# Patient Record
Sex: Female | Born: 1950 | Race: White | Hispanic: No | Marital: Married | Smoking: Current some day smoker
Health system: Southern US, Community
[De-identification: ages and names within clinical notes are randomized; demographics above are authoritative.]

## PROBLEM LIST (undated history)

## (undated) DIAGNOSIS — R42 Dizziness and giddiness: Secondary | ICD-10-CM

## (undated) DIAGNOSIS — M5430 Sciatica, unspecified side: Secondary | ICD-10-CM

## (undated) DIAGNOSIS — L4050 Arthropathic psoriasis, unspecified: Secondary | ICD-10-CM

## (undated) DIAGNOSIS — M4800 Spinal stenosis, site unspecified: Secondary | ICD-10-CM

## (undated) DIAGNOSIS — B029 Zoster without complications: Secondary | ICD-10-CM

## (undated) DIAGNOSIS — I10 Essential (primary) hypertension: Secondary | ICD-10-CM

## (undated) DIAGNOSIS — G4730 Sleep apnea, unspecified: Secondary | ICD-10-CM

## (undated) HISTORY — PX: HAND SURGERY: SHX662

## (undated) HISTORY — PX: NASAL SINUS SURGERY: SHX719

## (undated) HISTORY — PX: ABDOMINAL HYSTERECTOMY: SHX81

---

## 2012-12-03 DIAGNOSIS — M216X9 Other acquired deformities of unspecified foot: Secondary | ICD-10-CM | POA: Insufficient documentation

## 2012-12-03 DIAGNOSIS — M7750 Other enthesopathy of unspecified foot: Secondary | ICD-10-CM | POA: Insufficient documentation

## 2013-09-07 DIAGNOSIS — L4050 Arthropathic psoriasis, unspecified: Secondary | ICD-10-CM | POA: Insufficient documentation

## 2013-09-07 DIAGNOSIS — M1990 Unspecified osteoarthritis, unspecified site: Secondary | ICD-10-CM | POA: Insufficient documentation

## 2014-04-08 ENCOUNTER — Ambulatory Visit (INDEPENDENT_AMBULATORY_CARE_PROVIDER_SITE_OTHER): Payer: BLUE CROSS/BLUE SHIELD

## 2014-04-08 ENCOUNTER — Encounter: Payer: Self-pay | Admitting: Podiatry

## 2014-04-08 ENCOUNTER — Ambulatory Visit (INDEPENDENT_AMBULATORY_CARE_PROVIDER_SITE_OTHER): Payer: BLUE CROSS/BLUE SHIELD | Admitting: Podiatry

## 2014-04-08 VITALS — BP 113/76 | HR 77 | Resp 16 | Ht 63.0 in | Wt 182.0 lb

## 2014-04-08 DIAGNOSIS — M779 Enthesopathy, unspecified: Secondary | ICD-10-CM

## 2014-04-08 DIAGNOSIS — M21611 Bunion of right foot: Secondary | ICD-10-CM

## 2014-04-08 DIAGNOSIS — M2011 Hallux valgus (acquired), right foot: Secondary | ICD-10-CM

## 2014-04-08 NOTE — Progress Notes (Signed)
   Subjective:    Patient ID: Vanessa Clark, female    DOB: July 17, 1950, 64 y.o.   MRN: [id number]  HPI Comments: 64 year old female presents the office today with complaints of right foot pain. She states that she feels as if she is walking on a rock underneath her toes. 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 She states that she has difficulty walking on hardwood floors. She has so states that she has tried various shoe gear changes, custom orthotics as well as over-the-counter orthotics. She states that OTC orthotics helped more than the custom previously. She denies any recent injury or trauma to bilateral lower extremities. No other complaints at this time.  Foot Pain Associated symptoms include joint swelling.      Review of Systems  Constitutional:       Sweating   HENT: Positive for sinus pressure.   Musculoskeletal: Positive for joint swelling.  Allergic/Immunologic: Positive for environmental allergies.  All other systems reviewed and are negative.      Objective:   Physical Exam AAO 3, NAD DP/PT pulses palpable, CRT less than 3 seconds Protective sensation appears to be intact with Derrel Nip monofilament, vibratory sensation intact, Achilles tendon reflex intact. 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 On the right foot there is mild discomfort on the second MTPJ, mostly plantarly. There is no discomfort with MTPJ range of motion. There is no areas of pinpoint bony tenderness or pain with vibratory sensation to metatarsals or the digits bilaterally. There is trace edema overlying the second MTPJ without any overlying erythema or increase in warmth to the right. There is no overlying edema, erythema, increase in warmth to the left lower extremity. MMT 5/5, ROM WNL No open lesions or pre-ulcerative lesions. No pain with calf compression, swelling, warmth, erythema.    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 -Treatment options were discussed with the patient including alternatives, risks, complications. -I discussed the patient various shoe gear changes and orthotics to help offload the symptomatic area. She states that she has orthotics at home. I offered a steroid injection into the area however the patient declined. She is already taking meloxicam, which she states helps some. Discussed a graphite insert, however patient declined.  -Dispensed metatarsal pads to help offload the symptomatic area. -Follow-up as needed. In the meantime occurs call the office with any questions, concerns, changes symptoms. Discussed with her that if the symptoms have not resolved the next 2-4 weeks to call the office for follow-up appointment.

## 2014-04-12 ENCOUNTER — Telehealth: Payer: Self-pay | Admitting: *Deleted

## 2014-04-12 NOTE — Telephone Encounter (Signed)
It was the compound cream. I thought I had filled it out. If not, I will take care of it tomorrow. Thanks.

## 2014-04-12 NOTE — Telephone Encounter (Signed)
Pt called and stated you were going to give her a steroid cream. States she did not receive it.

## 2014-04-19 ENCOUNTER — Telehealth: Payer: Self-pay | Admitting: *Deleted

## 2014-04-19 ENCOUNTER — Telehealth: Payer: Self-pay

## 2014-04-19 NOTE — Telephone Encounter (Signed)
Pt called wanting to know if she can purchase some pads that Dr Jacqualyn Posey gave her last week, or where to purchase them from

## 2014-04-19 NOTE — Telephone Encounter (Signed)
Returned pts call. Asked pt to call back. (wants to purchase pads dr Jacqualyn Posey gave her)

## 2014-04-19 NOTE — Telephone Encounter (Signed)
Pt called wanting to know if she can purchase pads dr Jacqualyn Posey gave her. Told pt she can purchase pads here. Told pt cost is $3.00 a piece per cindy. Pt stated she will come by office to purchase some.

## 2014-04-22 DIAGNOSIS — M2011 Hallux valgus (acquired), right foot: Secondary | ICD-10-CM

## 2016-08-08 DIAGNOSIS — G4733 Obstructive sleep apnea (adult) (pediatric): Secondary | ICD-10-CM | POA: Diagnosis not present

## 2016-09-08 DIAGNOSIS — G4733 Obstructive sleep apnea (adult) (pediatric): Secondary | ICD-10-CM | POA: Diagnosis not present

## 2016-10-08 DIAGNOSIS — G4733 Obstructive sleep apnea (adult) (pediatric): Secondary | ICD-10-CM | POA: Diagnosis not present

## 2016-11-05 DIAGNOSIS — G4733 Obstructive sleep apnea (adult) (pediatric): Secondary | ICD-10-CM | POA: Diagnosis not present

## 2016-11-08 DIAGNOSIS — G4733 Obstructive sleep apnea (adult) (pediatric): Secondary | ICD-10-CM | POA: Diagnosis not present

## 2016-11-15 DIAGNOSIS — G4733 Obstructive sleep apnea (adult) (pediatric): Secondary | ICD-10-CM | POA: Diagnosis not present

## 2016-11-20 DIAGNOSIS — G4733 Obstructive sleep apnea (adult) (pediatric): Secondary | ICD-10-CM | POA: Diagnosis not present

## 2016-12-09 DIAGNOSIS — G4733 Obstructive sleep apnea (adult) (pediatric): Secondary | ICD-10-CM | POA: Diagnosis not present

## 2017-01-07 DIAGNOSIS — G4733 Obstructive sleep apnea (adult) (pediatric): Secondary | ICD-10-CM | POA: Diagnosis not present

## 2017-01-08 DIAGNOSIS — G4733 Obstructive sleep apnea (adult) (pediatric): Secondary | ICD-10-CM | POA: Diagnosis not present

## 2017-01-09 DIAGNOSIS — L821 Other seborrheic keratosis: Secondary | ICD-10-CM | POA: Diagnosis not present

## 2017-01-09 DIAGNOSIS — L708 Other acne: Secondary | ICD-10-CM | POA: Diagnosis not present

## 2017-01-09 DIAGNOSIS — L739 Follicular disorder, unspecified: Secondary | ICD-10-CM | POA: Diagnosis not present

## 2017-01-09 DIAGNOSIS — L820 Inflamed seborrheic keratosis: Secondary | ICD-10-CM | POA: Diagnosis not present

## 2017-01-09 DIAGNOSIS — L720 Epidermal cyst: Secondary | ICD-10-CM | POA: Diagnosis not present

## 2017-01-21 DIAGNOSIS — F900 Attention-deficit hyperactivity disorder, predominantly inattentive type: Secondary | ICD-10-CM | POA: Diagnosis not present

## 2017-01-21 DIAGNOSIS — Z6833 Body mass index (BMI) 33.0-33.9, adult: Secondary | ICD-10-CM | POA: Diagnosis not present

## 2017-01-21 DIAGNOSIS — F411 Generalized anxiety disorder: Secondary | ICD-10-CM | POA: Diagnosis not present

## 2017-02-18 DIAGNOSIS — G4733 Obstructive sleep apnea (adult) (pediatric): Secondary | ICD-10-CM | POA: Diagnosis not present

## 2017-03-05 DIAGNOSIS — E669 Obesity, unspecified: Secondary | ICD-10-CM | POA: Diagnosis not present

## 2017-03-05 DIAGNOSIS — L4050 Arthropathic psoriasis, unspecified: Secondary | ICD-10-CM | POA: Diagnosis not present

## 2017-03-05 DIAGNOSIS — Z79899 Other long term (current) drug therapy: Secondary | ICD-10-CM | POA: Diagnosis not present

## 2017-03-05 DIAGNOSIS — M19041 Primary osteoarthritis, right hand: Secondary | ICD-10-CM | POA: Diagnosis not present

## 2017-03-05 DIAGNOSIS — G4733 Obstructive sleep apnea (adult) (pediatric): Secondary | ICD-10-CM | POA: Insufficient documentation

## 2017-03-05 DIAGNOSIS — Z90710 Acquired absence of both cervix and uterus: Secondary | ICD-10-CM | POA: Diagnosis not present

## 2017-03-05 DIAGNOSIS — M19042 Primary osteoarthritis, left hand: Secondary | ICD-10-CM | POA: Diagnosis not present

## 2017-03-05 DIAGNOSIS — Z23 Encounter for immunization: Secondary | ICD-10-CM | POA: Diagnosis not present

## 2017-03-05 DIAGNOSIS — M8589 Other specified disorders of bone density and structure, multiple sites: Secondary | ICD-10-CM | POA: Diagnosis not present

## 2017-03-05 DIAGNOSIS — Z6831 Body mass index (BMI) 31.0-31.9, adult: Secondary | ICD-10-CM | POA: Diagnosis not present

## 2017-03-05 DIAGNOSIS — M170 Bilateral primary osteoarthritis of knee: Secondary | ICD-10-CM | POA: Diagnosis not present

## 2017-03-10 DIAGNOSIS — G4733 Obstructive sleep apnea (adult) (pediatric): Secondary | ICD-10-CM | POA: Diagnosis not present

## 2017-03-15 DIAGNOSIS — M8589 Other specified disorders of bone density and structure, multiple sites: Secondary | ICD-10-CM | POA: Diagnosis not present

## 2017-03-15 DIAGNOSIS — Z780 Asymptomatic menopausal state: Secondary | ICD-10-CM | POA: Diagnosis not present

## 2017-04-04 DIAGNOSIS — H04563 Stenosis of bilateral lacrimal punctum: Secondary | ICD-10-CM | POA: Diagnosis not present

## 2017-04-10 DIAGNOSIS — H2513 Age-related nuclear cataract, bilateral: Secondary | ICD-10-CM | POA: Diagnosis not present

## 2017-05-11 DIAGNOSIS — G4733 Obstructive sleep apnea (adult) (pediatric): Secondary | ICD-10-CM | POA: Diagnosis not present

## 2017-05-15 DIAGNOSIS — Z1389 Encounter for screening for other disorder: Secondary | ICD-10-CM | POA: Diagnosis not present

## 2017-05-15 DIAGNOSIS — Z79899 Other long term (current) drug therapy: Secondary | ICD-10-CM | POA: Diagnosis not present

## 2017-05-15 DIAGNOSIS — K219 Gastro-esophageal reflux disease without esophagitis: Secondary | ICD-10-CM | POA: Diagnosis not present

## 2017-05-15 DIAGNOSIS — R635 Abnormal weight gain: Secondary | ICD-10-CM | POA: Diagnosis not present

## 2017-05-15 DIAGNOSIS — F321 Major depressive disorder, single episode, moderate: Secondary | ICD-10-CM | POA: Diagnosis not present

## 2017-05-15 DIAGNOSIS — Z6834 Body mass index (BMI) 34.0-34.9, adult: Secondary | ICD-10-CM | POA: Diagnosis not present

## 2017-05-15 DIAGNOSIS — L821 Other seborrheic keratosis: Secondary | ICD-10-CM | POA: Diagnosis not present

## 2017-05-15 DIAGNOSIS — D518 Other vitamin B12 deficiency anemias: Secondary | ICD-10-CM | POA: Diagnosis not present

## 2017-05-15 DIAGNOSIS — Z0000 Encounter for general adult medical examination without abnormal findings: Secondary | ICD-10-CM | POA: Diagnosis not present

## 2017-06-08 DIAGNOSIS — G4733 Obstructive sleep apnea (adult) (pediatric): Secondary | ICD-10-CM | POA: Diagnosis not present

## 2017-07-04 DIAGNOSIS — H04569 Stenosis of unspecified lacrimal punctum: Secondary | ICD-10-CM | POA: Diagnosis not present

## 2017-07-09 DIAGNOSIS — G4733 Obstructive sleep apnea (adult) (pediatric): Secondary | ICD-10-CM | POA: Diagnosis not present

## 2017-08-08 DIAGNOSIS — L820 Inflamed seborrheic keratosis: Secondary | ICD-10-CM | POA: Diagnosis not present

## 2017-08-08 DIAGNOSIS — G4733 Obstructive sleep apnea (adult) (pediatric): Secondary | ICD-10-CM | POA: Diagnosis not present

## 2017-08-08 DIAGNOSIS — L821 Other seborrheic keratosis: Secondary | ICD-10-CM | POA: Diagnosis not present

## 2017-08-15 ENCOUNTER — Encounter: Payer: Self-pay | Admitting: *Deleted

## 2017-08-15 ENCOUNTER — Other Ambulatory Visit: Payer: Self-pay | Admitting: *Deleted

## 2017-08-15 NOTE — Patient Outreach (Signed)
[facility]) Care Management  08/15/2017  Vanessa Clark December 25, 1950 [id number]  Referral from [place]. Reason: Member request for help & guidance with medical diagnosis-Vertigo.  Telephone call to patient who was advised of reason for call. HIPPA verification received.  Patient verified that she currently has [employer] insurance.   Patient voices that she has been to her MD office & has received information on vertigo. States she needs no further information and does not currently need [facility] services.   States she already has contact information and does not need educational information regarding [facility] services.   Patient has declined services and has no healthcare concerns.  Plan: Case closure.   Sherrin Daisy, RN BSN [place] Management Coordinator [facility] Care Management  [phone]

## 2017-09-30 DIAGNOSIS — G4733 Obstructive sleep apnea (adult) (pediatric): Secondary | ICD-10-CM | POA: Diagnosis not present

## 2017-11-05 DIAGNOSIS — Z1231 Encounter for screening mammogram for malignant neoplasm of breast: Secondary | ICD-10-CM | POA: Diagnosis not present

## 2017-12-18 ENCOUNTER — Encounter: Payer: Self-pay | Admitting: Podiatry

## 2017-12-18 ENCOUNTER — Ambulatory Visit: Payer: PPO | Admitting: Podiatry

## 2017-12-18 DIAGNOSIS — L600 Ingrowing nail: Secondary | ICD-10-CM

## 2017-12-18 MED ORDER — NEOMYCIN-POLYMYXIN-HC 1 % OT SOLN: OTIC | 1 refills | Status: AC

## 2017-12-18 NOTE — Progress Notes (Signed)
  Subjective:  Patient ID: Vanessa Clark, female    DOB: Jul 13, 1950,  MRN: [id number] HPI Chief Complaint  Patient presents with  . Nail Problem    Toenails bilateral - thick and discolored nails x years, thinks it could be from her psoriatic arthritis, right hallux is tender though  . New Patient (Initial Visit)    Est pt 04/2014    67 y.o. female presents with the above complaint.   ROS: Denies fever chills nausea vomiting muscle aches pains calf pain back pain chest pain shortness of breath.  No past medical history on file.   Current Outpatient Medications:  .  Adalimumab (HUMIRA [place]), Inject into the skin., Disp: , Rfl:  .  ALPRAZolam (XANAX) 0.5 MG tablet, Take 0.5 mg by mouth., Disp: , Rfl:  .  betamethasone dipropionate (DIPROLENE) 0.05 % cream, APPLY TO AFFECTED AREA TWICE A DAY, Disp: , Rfl: 2 .  loratadine (CLARITIN) 10 MG tablet, Frequency:PRN   Dosage:0.0     Instructions:  Note:Dose: UNKNOWN, Disp: , Rfl:  .  meloxicam (MOBIC) 15 MG tablet, Take 15 mg by mouth., Disp: , Rfl:  .  methylphenidate 10 MG ER tablet, , Disp: , Rfl:  .  NEOMYCIN-POLYMYXIN-HYDROCORTISONE (CORTISPORIN) 1 % SOLN OTIC solution, Apply 1-2 drops to toe BID after soaking, Disp: 10 mL, Rfl: 1 .  omeprazole (PRILOSEC) 20 MG capsule, Take 20 mg by mouth., Disp: , Rfl:  .  Probiotic Product (PROBIOTIC DAILY PO), Take by mouth daily., Disp: , Rfl:  .  venlafaxine XR (EFFEXOR-XR) 75 MG 24 hr capsule, Take 75 mg by mouth., Disp: , Rfl:   Allergies  Allergen Reactions  . Sulfa Antibiotics Other (See Comments)    Not effective   Review of Systems Objective:  There were no vitals filed for this visit.  General: Well developed, nourished, in no acute distress, alert and oriented x3   Dermatological: Skin is warm, dry and supple bilateral. Nails x 10 are well maintained; remaining integument appears unremarkable at this time. There are no open sores, no preulcerative lesions, no rash or signs of  infection present.  Sharp incurvated nail margin along the tibial border of the hallux right.  No purulence no malodor.  Vascular: Dorsalis Pedis artery and Posterior Tibial artery pedal pulses are 2/4 bilateral with immedate capillary fill time. Pedal hair growth present. No varicosities and no lower extremity edema present bilateral.   Neruologic: Grossly intact via light touch bilateral. Vibratory intact via tuning fork bilateral. Protective threshold with Semmes Wienstein monofilament intact to all pedal sites bilateral. Patellar and Achilles deep tendon reflexes 2+ bilateral. No Babinski or clonus noted bilateral.   Musculoskeletal: No gross boney pedal deformities bilateral. No pain, crepitus, or limitation noted with foot and ankle range of motion bilateral. Muscular strength 5/5 in all groups tested bilateral.  Gait: Unassisted, Nonantalgic.    Radiographs:  None taken  Assessment & Plan:   Assessment: Ingrown toenail tibial border hallux right.  Plan: Discussed etiology pathology conservative versus surgical therapies.  At this point a chemical matrixectomy was performed along the tibial border.  Tolerated procedure well after local anesthesia was administered without complications.  She was given both oral and written home-going instruction for care and soaking of the toe as well as prescription for Corticosporin otic to be applied twice daily after soaking.  I will follow-up with her in 2 weeks.     Max T. [place], [place]

## 2017-12-18 NOTE — Patient Instructions (Signed)

## 2017-12-19 DIAGNOSIS — F411 Generalized anxiety disorder: Secondary | ICD-10-CM | POA: Diagnosis not present

## 2017-12-19 DIAGNOSIS — F321 Major depressive disorder, single episode, moderate: Secondary | ICD-10-CM | POA: Diagnosis not present

## 2017-12-19 DIAGNOSIS — Z6834 Body mass index (BMI) 34.0-34.9, adult: Secondary | ICD-10-CM | POA: Diagnosis not present

## 2018-01-01 ENCOUNTER — Ambulatory Visit: Payer: PPO | Admitting: Podiatry

## 2018-01-06 DIAGNOSIS — G4733 Obstructive sleep apnea (adult) (pediatric): Secondary | ICD-10-CM | POA: Diagnosis not present

## 2018-01-08 DIAGNOSIS — L820 Inflamed seborrheic keratosis: Secondary | ICD-10-CM | POA: Diagnosis not present

## 2018-01-08 DIAGNOSIS — L814 Other melanin hyperpigmentation: Secondary | ICD-10-CM | POA: Diagnosis not present

## 2018-01-08 DIAGNOSIS — L821 Other seborrheic keratosis: Secondary | ICD-10-CM | POA: Diagnosis not present

## 2018-01-08 DIAGNOSIS — D229 Melanocytic nevi, unspecified: Secondary | ICD-10-CM | POA: Diagnosis not present

## 2018-01-08 DIAGNOSIS — L738 Other specified follicular disorders: Secondary | ICD-10-CM | POA: Diagnosis not present

## 2018-03-04 DIAGNOSIS — G4733 Obstructive sleep apnea (adult) (pediatric): Secondary | ICD-10-CM | POA: Diagnosis not present

## 2018-03-04 DIAGNOSIS — Z9989 Dependence on other enabling machines and devices: Secondary | ICD-10-CM | POA: Diagnosis not present

## 2018-03-04 DIAGNOSIS — I4510 Unspecified right bundle-branch block: Secondary | ICD-10-CM | POA: Diagnosis not present

## 2018-03-04 DIAGNOSIS — M159 Polyosteoarthritis, unspecified: Secondary | ICD-10-CM | POA: Diagnosis not present

## 2018-03-04 DIAGNOSIS — L821 Other seborrheic keratosis: Secondary | ICD-10-CM | POA: Diagnosis not present

## 2018-03-04 DIAGNOSIS — M25561 Pain in right knee: Secondary | ICD-10-CM | POA: Diagnosis not present

## 2018-03-04 DIAGNOSIS — L918 Other hypertrophic disorders of the skin: Secondary | ICD-10-CM | POA: Diagnosis not present

## 2018-03-04 DIAGNOSIS — M7918 Myalgia, other site: Secondary | ICD-10-CM | POA: Diagnosis not present

## 2018-03-04 DIAGNOSIS — L4050 Arthropathic psoriasis, unspecified: Secondary | ICD-10-CM | POA: Diagnosis not present

## 2018-03-04 DIAGNOSIS — Z6836 Body mass index (BMI) 36.0-36.9, adult: Secondary | ICD-10-CM | POA: Diagnosis not present

## 2018-03-04 DIAGNOSIS — M5136 Other intervertebral disc degeneration, lumbar region: Secondary | ICD-10-CM | POA: Diagnosis not present

## 2018-03-04 DIAGNOSIS — I4891 Unspecified atrial fibrillation: Secondary | ICD-10-CM | POA: Diagnosis not present

## 2018-03-19 DIAGNOSIS — F321 Major depressive disorder, single episode, moderate: Secondary | ICD-10-CM | POA: Diagnosis not present

## 2018-03-19 DIAGNOSIS — L821 Other seborrheic keratosis: Secondary | ICD-10-CM | POA: Diagnosis not present

## 2018-03-19 DIAGNOSIS — R030 Elevated blood-pressure reading, without diagnosis of hypertension: Secondary | ICD-10-CM | POA: Diagnosis not present

## 2018-03-19 DIAGNOSIS — F411 Generalized anxiety disorder: Secondary | ICD-10-CM | POA: Diagnosis not present

## 2018-03-19 DIAGNOSIS — Z6833 Body mass index (BMI) 33.0-33.9, adult: Secondary | ICD-10-CM | POA: Diagnosis not present

## 2018-04-08 DIAGNOSIS — G4733 Obstructive sleep apnea (adult) (pediatric): Secondary | ICD-10-CM | POA: Diagnosis not present

## 2018-04-14 DIAGNOSIS — L820 Inflamed seborrheic keratosis: Secondary | ICD-10-CM | POA: Diagnosis not present

## 2018-04-14 DIAGNOSIS — L821 Other seborrheic keratosis: Secondary | ICD-10-CM | POA: Diagnosis not present

## 2018-05-06 DIAGNOSIS — Z0120 Encounter for dental examination and cleaning without abnormal findings: Secondary | ICD-10-CM | POA: Diagnosis not present

## 2018-05-08 DIAGNOSIS — R0789 Other chest pain: Secondary | ICD-10-CM | POA: Diagnosis not present

## 2018-05-08 DIAGNOSIS — Z79899 Other long term (current) drug therapy: Secondary | ICD-10-CM | POA: Diagnosis not present

## 2018-05-08 DIAGNOSIS — F411 Generalized anxiety disorder: Secondary | ICD-10-CM | POA: Diagnosis not present

## 2018-05-08 DIAGNOSIS — D519 Vitamin B12 deficiency anemia, unspecified: Secondary | ICD-10-CM | POA: Diagnosis not present

## 2018-05-08 DIAGNOSIS — I10 Essential (primary) hypertension: Secondary | ICD-10-CM | POA: Diagnosis not present

## 2018-05-08 DIAGNOSIS — Z6835 Body mass index (BMI) 35.0-35.9, adult: Secondary | ICD-10-CM | POA: Diagnosis not present

## 2018-05-15 DIAGNOSIS — I208 Other forms of angina pectoris: Secondary | ICD-10-CM | POA: Diagnosis not present

## 2018-05-15 DIAGNOSIS — R002 Palpitations: Secondary | ICD-10-CM | POA: Diagnosis not present

## 2018-05-15 DIAGNOSIS — R0602 Shortness of breath: Secondary | ICD-10-CM | POA: Insufficient documentation

## 2018-05-15 DIAGNOSIS — I10 Essential (primary) hypertension: Secondary | ICD-10-CM | POA: Insufficient documentation

## 2018-05-15 DIAGNOSIS — E782 Mixed hyperlipidemia: Secondary | ICD-10-CM | POA: Diagnosis not present

## 2018-06-03 DIAGNOSIS — I208 Other forms of angina pectoris: Secondary | ICD-10-CM | POA: Diagnosis not present

## 2018-06-04 DIAGNOSIS — R002 Palpitations: Secondary | ICD-10-CM | POA: Diagnosis not present

## 2018-06-13 DIAGNOSIS — R002 Palpitations: Secondary | ICD-10-CM | POA: Diagnosis not present

## 2018-06-13 DIAGNOSIS — R0602 Shortness of breath: Secondary | ICD-10-CM | POA: Diagnosis not present

## 2018-06-13 DIAGNOSIS — I10 Essential (primary) hypertension: Secondary | ICD-10-CM | POA: Diagnosis not present

## 2018-06-13 DIAGNOSIS — E782 Mixed hyperlipidemia: Secondary | ICD-10-CM | POA: Diagnosis not present

## 2018-06-18 DIAGNOSIS — I10 Essential (primary) hypertension: Secondary | ICD-10-CM | POA: Diagnosis not present

## 2018-06-18 DIAGNOSIS — Z6835 Body mass index (BMI) 35.0-35.9, adult: Secondary | ICD-10-CM | POA: Diagnosis not present

## 2018-06-18 DIAGNOSIS — F411 Generalized anxiety disorder: Secondary | ICD-10-CM | POA: Diagnosis not present

## 2018-07-08 DIAGNOSIS — G4733 Obstructive sleep apnea (adult) (pediatric): Secondary | ICD-10-CM | POA: Diagnosis not present

## 2018-10-06 DIAGNOSIS — G4733 Obstructive sleep apnea (adult) (pediatric): Secondary | ICD-10-CM | POA: Diagnosis not present

## 2018-10-13 DIAGNOSIS — L578 Other skin changes due to chronic exposure to nonionizing radiation: Secondary | ICD-10-CM | POA: Diagnosis not present

## 2018-10-13 DIAGNOSIS — L820 Inflamed seborrheic keratosis: Secondary | ICD-10-CM | POA: Diagnosis not present

## 2018-10-13 DIAGNOSIS — L821 Other seborrheic keratosis: Secondary | ICD-10-CM | POA: Diagnosis not present

## 2018-10-29 DIAGNOSIS — B078 Other viral warts: Secondary | ICD-10-CM | POA: Diagnosis not present

## 2018-10-29 DIAGNOSIS — L821 Other seborrheic keratosis: Secondary | ICD-10-CM | POA: Diagnosis not present

## 2018-10-29 DIAGNOSIS — L918 Other hypertrophic disorders of the skin: Secondary | ICD-10-CM | POA: Diagnosis not present

## 2018-10-29 DIAGNOSIS — L820 Inflamed seborrheic keratosis: Secondary | ICD-10-CM | POA: Diagnosis not present

## 2018-12-02 DIAGNOSIS — M160 Bilateral primary osteoarthritis of hip: Secondary | ICD-10-CM | POA: Diagnosis not present

## 2018-12-02 DIAGNOSIS — E669 Obesity, unspecified: Secondary | ICD-10-CM | POA: Diagnosis not present

## 2018-12-02 DIAGNOSIS — Z79899 Other long term (current) drug therapy: Secondary | ICD-10-CM | POA: Diagnosis not present

## 2018-12-02 DIAGNOSIS — M5136 Other intervertebral disc degeneration, lumbar region: Secondary | ICD-10-CM | POA: Diagnosis not present

## 2018-12-02 DIAGNOSIS — M1712 Unilateral primary osteoarthritis, left knee: Secondary | ICD-10-CM | POA: Diagnosis not present

## 2018-12-02 DIAGNOSIS — M170 Bilateral primary osteoarthritis of knee: Secondary | ICD-10-CM | POA: Diagnosis not present

## 2018-12-02 DIAGNOSIS — N200 Calculus of kidney: Secondary | ICD-10-CM | POA: Diagnosis not present

## 2018-12-02 DIAGNOSIS — L4050 Arthropathic psoriasis, unspecified: Secondary | ICD-10-CM | POA: Diagnosis not present

## 2018-12-02 DIAGNOSIS — M1711 Unilateral primary osteoarthritis, right knee: Secondary | ICD-10-CM | POA: Diagnosis not present

## 2018-12-02 DIAGNOSIS — Z6835 Body mass index (BMI) 35.0-35.9, adult: Secondary | ICD-10-CM | POA: Diagnosis not present

## 2018-12-22 DIAGNOSIS — M25561 Pain in right knee: Secondary | ICD-10-CM | POA: Diagnosis not present

## 2018-12-31 DIAGNOSIS — M25561 Pain in right knee: Secondary | ICD-10-CM | POA: Diagnosis not present

## 2019-01-05 DIAGNOSIS — G4733 Obstructive sleep apnea (adult) (pediatric): Secondary | ICD-10-CM | POA: Diagnosis not present

## 2019-01-06 DIAGNOSIS — G4733 Obstructive sleep apnea (adult) (pediatric): Secondary | ICD-10-CM | POA: Diagnosis not present

## 2019-01-08 DIAGNOSIS — M25561 Pain in right knee: Secondary | ICD-10-CM | POA: Diagnosis not present

## 2019-01-16 DIAGNOSIS — M25561 Pain in right knee: Secondary | ICD-10-CM | POA: Diagnosis not present

## 2019-01-23 DIAGNOSIS — M25561 Pain in right knee: Secondary | ICD-10-CM | POA: Diagnosis not present

## 2019-02-02 DIAGNOSIS — M25561 Pain in right knee: Secondary | ICD-10-CM | POA: Diagnosis not present

## 2019-02-06 DIAGNOSIS — L918 Other hypertrophic disorders of the skin: Secondary | ICD-10-CM | POA: Diagnosis not present

## 2019-02-06 DIAGNOSIS — L820 Inflamed seborrheic keratosis: Secondary | ICD-10-CM | POA: Diagnosis not present

## 2019-02-06 DIAGNOSIS — L821 Other seborrheic keratosis: Secondary | ICD-10-CM | POA: Diagnosis not present

## 2019-02-06 DIAGNOSIS — B079 Viral wart, unspecified: Secondary | ICD-10-CM | POA: Diagnosis not present

## 2019-03-02 DIAGNOSIS — M25561 Pain in right knee: Secondary | ICD-10-CM | POA: Diagnosis not present

## 2019-03-04 DIAGNOSIS — L821 Other seborrheic keratosis: Secondary | ICD-10-CM | POA: Diagnosis not present

## 2019-03-04 DIAGNOSIS — I10 Essential (primary) hypertension: Secondary | ICD-10-CM | POA: Diagnosis not present

## 2019-03-04 DIAGNOSIS — F411 Generalized anxiety disorder: Secondary | ICD-10-CM | POA: Diagnosis not present

## 2019-03-04 DIAGNOSIS — E669 Obesity, unspecified: Secondary | ICD-10-CM | POA: Diagnosis not present

## 2019-03-05 DIAGNOSIS — L821 Other seborrheic keratosis: Secondary | ICD-10-CM | POA: Diagnosis not present

## 2019-03-05 DIAGNOSIS — L820 Inflamed seborrheic keratosis: Secondary | ICD-10-CM | POA: Diagnosis not present

## 2019-03-05 DIAGNOSIS — B078 Other viral warts: Secondary | ICD-10-CM | POA: Diagnosis not present

## 2019-03-10 DIAGNOSIS — H2513 Age-related nuclear cataract, bilateral: Secondary | ICD-10-CM | POA: Diagnosis not present

## 2019-03-18 DIAGNOSIS — Z1211 Encounter for screening for malignant neoplasm of colon: Secondary | ICD-10-CM | POA: Diagnosis not present

## 2019-03-23 DIAGNOSIS — Z6838 Body mass index (BMI) 38.0-38.9, adult: Secondary | ICD-10-CM | POA: Diagnosis not present

## 2019-03-23 DIAGNOSIS — L259 Unspecified contact dermatitis, unspecified cause: Secondary | ICD-10-CM | POA: Diagnosis not present

## 2019-03-23 DIAGNOSIS — R600 Localized edema: Secondary | ICD-10-CM | POA: Diagnosis not present

## 2019-04-13 DIAGNOSIS — L820 Inflamed seborrheic keratosis: Secondary | ICD-10-CM | POA: Diagnosis not present

## 2019-04-13 DIAGNOSIS — Z872 Personal history of diseases of the skin and subcutaneous tissue: Secondary | ICD-10-CM | POA: Diagnosis not present

## 2019-04-24 DIAGNOSIS — R0602 Shortness of breath: Secondary | ICD-10-CM | POA: Diagnosis not present

## 2019-04-24 DIAGNOSIS — R002 Palpitations: Secondary | ICD-10-CM | POA: Diagnosis not present

## 2019-04-24 DIAGNOSIS — I10 Essential (primary) hypertension: Secondary | ICD-10-CM | POA: Diagnosis not present

## 2019-05-06 DIAGNOSIS — G4733 Obstructive sleep apnea (adult) (pediatric): Secondary | ICD-10-CM | POA: Diagnosis not present

## 2019-05-12 DIAGNOSIS — M79642 Pain in left hand: Secondary | ICD-10-CM | POA: Diagnosis not present

## 2019-05-12 DIAGNOSIS — M79641 Pain in right hand: Secondary | ICD-10-CM | POA: Diagnosis not present

## 2019-06-04 DIAGNOSIS — R0602 Shortness of breath: Secondary | ICD-10-CM | POA: Diagnosis not present

## 2019-06-04 DIAGNOSIS — E782 Mixed hyperlipidemia: Secondary | ICD-10-CM | POA: Diagnosis not present

## 2019-06-04 DIAGNOSIS — G4733 Obstructive sleep apnea (adult) (pediatric): Secondary | ICD-10-CM | POA: Diagnosis not present

## 2019-06-04 DIAGNOSIS — I10 Essential (primary) hypertension: Secondary | ICD-10-CM | POA: Diagnosis not present

## 2019-07-06 DIAGNOSIS — I10 Essential (primary) hypertension: Secondary | ICD-10-CM | POA: Diagnosis not present

## 2019-07-06 DIAGNOSIS — G4733 Obstructive sleep apnea (adult) (pediatric): Secondary | ICD-10-CM | POA: Diagnosis not present

## 2019-07-06 DIAGNOSIS — G8918 Other acute postprocedural pain: Secondary | ICD-10-CM | POA: Diagnosis not present

## 2019-07-06 DIAGNOSIS — Z6838 Body mass index (BMI) 38.0-38.9, adult: Secondary | ICD-10-CM | POA: Diagnosis not present

## 2019-07-06 DIAGNOSIS — Z23 Encounter for immunization: Secondary | ICD-10-CM | POA: Diagnosis not present

## 2019-07-22 DIAGNOSIS — Z13220 Encounter for screening for lipoid disorders: Secondary | ICD-10-CM | POA: Diagnosis not present

## 2019-07-22 DIAGNOSIS — F321 Major depressive disorder, single episode, moderate: Secondary | ICD-10-CM | POA: Diagnosis not present

## 2019-07-22 DIAGNOSIS — Z1389 Encounter for screening for other disorder: Secondary | ICD-10-CM | POA: Diagnosis not present

## 2019-07-22 DIAGNOSIS — K219 Gastro-esophageal reflux disease without esophagitis: Secondary | ICD-10-CM | POA: Diagnosis not present

## 2019-07-22 DIAGNOSIS — E559 Vitamin D deficiency, unspecified: Secondary | ICD-10-CM | POA: Diagnosis not present

## 2019-07-22 DIAGNOSIS — Z0000 Encounter for general adult medical examination without abnormal findings: Secondary | ICD-10-CM | POA: Diagnosis not present

## 2019-07-22 DIAGNOSIS — I10 Essential (primary) hypertension: Secondary | ICD-10-CM | POA: Diagnosis not present

## 2019-07-22 DIAGNOSIS — Z6834 Body mass index (BMI) 34.0-34.9, adult: Secondary | ICD-10-CM | POA: Diagnosis not present

## 2019-07-22 DIAGNOSIS — L821 Other seborrheic keratosis: Secondary | ICD-10-CM | POA: Diagnosis not present

## 2019-07-22 DIAGNOSIS — Z1331 Encounter for screening for depression: Secondary | ICD-10-CM | POA: Diagnosis not present

## 2019-07-22 DIAGNOSIS — F411 Generalized anxiety disorder: Secondary | ICD-10-CM | POA: Diagnosis not present

## 2019-07-22 DIAGNOSIS — R7301 Impaired fasting glucose: Secondary | ICD-10-CM | POA: Diagnosis not present

## 2019-07-22 DIAGNOSIS — Z79899 Other long term (current) drug therapy: Secondary | ICD-10-CM | POA: Diagnosis not present

## 2019-08-11 DIAGNOSIS — G4733 Obstructive sleep apnea (adult) (pediatric): Secondary | ICD-10-CM | POA: Diagnosis not present

## 2019-08-11 DIAGNOSIS — I10 Essential (primary) hypertension: Secondary | ICD-10-CM | POA: Diagnosis not present

## 2019-08-11 DIAGNOSIS — E782 Mixed hyperlipidemia: Secondary | ICD-10-CM | POA: Diagnosis not present

## 2019-09-18 DIAGNOSIS — G4733 Obstructive sleep apnea (adult) (pediatric): Secondary | ICD-10-CM | POA: Diagnosis not present

## 2019-10-01 DIAGNOSIS — I10 Essential (primary) hypertension: Secondary | ICD-10-CM | POA: Diagnosis not present

## 2019-10-01 DIAGNOSIS — D519 Vitamin B12 deficiency anemia, unspecified: Secondary | ICD-10-CM | POA: Diagnosis not present

## 2019-10-09 DIAGNOSIS — D519 Vitamin B12 deficiency anemia, unspecified: Secondary | ICD-10-CM | POA: Diagnosis not present

## 2019-10-19 DIAGNOSIS — D519 Vitamin B12 deficiency anemia, unspecified: Secondary | ICD-10-CM | POA: Diagnosis not present

## 2019-11-03 DIAGNOSIS — G4733 Obstructive sleep apnea (adult) (pediatric): Secondary | ICD-10-CM | POA: Diagnosis not present

## 2019-11-10 DIAGNOSIS — I10 Essential (primary) hypertension: Secondary | ICD-10-CM | POA: Diagnosis not present

## 2019-11-10 DIAGNOSIS — F411 Generalized anxiety disorder: Secondary | ICD-10-CM | POA: Diagnosis not present

## 2019-11-10 DIAGNOSIS — D518 Other vitamin B12 deficiency anemias: Secondary | ICD-10-CM | POA: Diagnosis not present

## 2019-11-10 DIAGNOSIS — Z6835 Body mass index (BMI) 35.0-35.9, adult: Secondary | ICD-10-CM | POA: Diagnosis not present

## 2019-11-17 ENCOUNTER — Other Ambulatory Visit: Payer: Self-pay

## 2019-11-17 ENCOUNTER — Ambulatory Visit: Payer: PPO | Admitting: Dermatology

## 2019-11-17 DIAGNOSIS — L820 Inflamed seborrheic keratosis: Secondary | ICD-10-CM | POA: Diagnosis not present

## 2019-11-17 DIAGNOSIS — L578 Other skin changes due to chronic exposure to nonionizing radiation: Secondary | ICD-10-CM

## 2019-11-17 DIAGNOSIS — D485 Neoplasm of uncertain behavior of skin: Secondary | ICD-10-CM | POA: Diagnosis not present

## 2019-11-17 DIAGNOSIS — L821 Other seborrheic keratosis: Secondary | ICD-10-CM

## 2019-11-17 DIAGNOSIS — B079 Viral wart, unspecified: Secondary | ICD-10-CM

## 2019-11-17 NOTE — Patient Instructions (Signed)

## 2019-11-17 NOTE — Progress Notes (Signed)
Follow-Up Visit   Subjective  Vanessa Clark is a 69 y.o. female who presents for the following: lesion (patient would like lesion to be cut off and not frozen on her right mid back), lesion (irritated, bleeding - of the left breast patient would like lesion treated), and warts (of the R middle finger and L middle finger - has been treated with LN2 in the past ).  The following portions of the chart were reviewed this encounter and updated as appropriate:  Tobacco  Allergies  Meds  Problems  Med Hx  Surg Hx  Fam Hx     Review of Systems:  No other skin or systemic complaints except as noted in HPI or Assessment and Plan.  Objective  Well appearing patient in no apparent distress; mood and affect are within normal limits.  A focused examination was performed including the trunk and extremities. Relevant physical exam findings are noted in the Assessment and Plan.  Objective  R low back lat: Hyperkeratotic papule 1.1 cm    Objective  Neck, trunk, extremities, L breast (LN2): Erythematous keratotic or waxy stuck-on papule or plaque.   Objective  R middle finger lat side, L middle finger tip (2): Verrucous papules -- Discussed viral etiology and contagion.   Assessment & Plan  Neoplasm of uncertain behavior of skin R low back lat  Epidermal / dermal shaving  Lesion diameter (cm):  1.1 Informed consent: discussed and consent obtained   Timeout: patient name, date of birth, surgical site, and procedure verified   Procedure prep:  Patient was prepped and draped in usual sterile fashion Prep type:  Isopropyl alcohol Anesthesia: the lesion was anesthetized in a standard fashion   Anesthetic:  1% lidocaine w/ epinephrine 1-100,000 buffered w/ 8.4% NaHCO3 Instrument used: flexible razor blade   Hemostasis achieved with: pressure, aluminum chloride and electrodesiccation   Outcome: patient tolerated procedure well   Post-procedure details: sterile dressing applied and  wound care instructions given   Dressing type: bandage and petrolatum    Specimen 1 - Surgical pathology Differential Diagnosis: D48.5 ISK vs SCC vs other  Check Margins: No Hyperkeratotic papule 1.1 cm  Inflamed seborrheic keratosis Neck, trunk, extremities, L breast (LN2)  Discussed with patient that OTC products that freeze the lesions may not be safe as it can cause ulcerations.   Destruction of lesion - Neck, trunk, extremities, L breast (LN2) Complexity: simple   Destruction method: cryotherapy   Informed consent: discussed and consent obtained   Timeout:  patient name, date of birth, surgical site, and procedure verified Lesion destroyed using liquid nitrogen: Yes   Region frozen until ice ball extended beyond lesion: Yes   Outcome: patient tolerated procedure well with no complications   Post-procedure details: wound care instructions given    Viral warts, unspecified type (2) R middle finger lat side, L middle finger tip  Destruction of lesion - R middle finger lat side, L middle finger tip Complexity: simple   Destruction method: cryotherapy   Informed consent: discussed and consent obtained   Timeout:  patient name, date of birth, surgical site, and procedure verified Lesion destroyed using liquid nitrogen: Yes   Region frozen until ice ball extended beyond lesion: Yes   Outcome: patient tolerated procedure well with no complications   Post-procedure details: wound care instructions given    Destruction of lesion - R middle finger lat side, L middle finger tip Complexity: simple   Destruction method comment:  Candida injection performed today Informed consent:  discussed and consent obtained   Timeout:  patient name, date of birth, surgical site, and procedure verified Procedure prep:  Patient was prepped and draped in usual sterile fashion Outcome: patient tolerated procedure well with no complications   Post-procedure details: wound care instructions given     Additional details:  Patient expressed verbal consent for procedure - 0.1 cc injected into the R middle finger lat side (1st injection)   Seborrheic Keratoses - Stuck-on, waxy, tan-brown papules and plaques  - Discussed benign etiology and prognosis. - Observe - Call for any changes  Actinic Damage - diffuse scaly erythematous macules with underlying dyspigmentation - Recommend daily broad spectrum sunscreen SPF 30+ to sun-exposed areas, reapply every 2 hours as needed.  - Call for new or changing lesions.  Return in about 4 weeks (around 12/15/2019) for follow up in 4-6 weeks for Candida injection.  Luther Redo, CMA, am acting as scribe for Sarina Ser, MD .  Documentation: I have reviewed the above documentation for accuracy and completeness, and I agree with the above.  Sarina Ser, MD

## 2019-11-23 ENCOUNTER — Telehealth: Payer: Self-pay

## 2019-11-23 NOTE — Telephone Encounter (Signed)
-----   Message from Ralene Bathe, MD sent at 11/19/2019  6:35 PM EDT ----- Skin , right low back lat VERRUCA VULGARIS, IRRITATED, INFLAMED  Benign inflamed viral wart May recur No further treatment necessary at this time

## 2019-11-23 NOTE — Telephone Encounter (Signed)
Left message for patient to call office for results/hd 

## 2019-11-24 ENCOUNTER — Encounter: Payer: Self-pay | Admitting: Dermatology

## 2019-11-25 ENCOUNTER — Telehealth: Payer: Self-pay

## 2019-11-25 NOTE — Telephone Encounter (Signed)
Patient informed of results. Patient had mentioned that Dr. Nehemiah Massed was going to discuss with the other providers in the office a less painful way to treat all of her ISK's and give her a return phone call.

## 2019-11-25 NOTE — Telephone Encounter (Signed)
-----   Message from Ralene Bathe, MD sent at 11/19/2019  6:35 PM EDT ----- Skin , right low back lat VERRUCA VULGARIS, IRRITATED, INFLAMED  Benign inflamed viral wart May recur No further treatment necessary at this time

## 2019-12-02 ENCOUNTER — Telehealth: Payer: Self-pay

## 2019-12-02 NOTE — Telephone Encounter (Signed)
-----   Message from Ralene Bathe, MD sent at 11/26/2019  6:50 PM EDT ----- The below is Dr Robbi Garter response.  Cimetidine may be an option to try.  Still awaiting response from Dr Nicole Kindred - will probably be next week.  You may share this with pt and we can discuss Dr Robbi Garter and Dr Les Pou suggestions with pt and initiate at a future visit if she wants to pursue. Please call and advise pt. ----- Message ----- From: Alfonso Patten, MD Sent: 11/26/2019  11:56 AM EDT To: Ralene Bathe, MD, Brendolyn Diona, MD  How thick are they? Thinner ones may respond to a chemical peel. Also if many appear to have a verrucous component, could consider adding cimetidine for a trial to see if any improvement   ----- Message ----- From: Ralene Bathe, MD Sent: 11/25/2019   5:14 PM EDT To: Brendolyn Antoniette, MD, Alfonso Patten, MD  This pt has many (hundreds) of Sks and they are symptomatic and I freeze about 50-100 each time she comes but she hates the pain and says she has to recover for a couple days each time. She re-doubles the growths between visits. She wonders if there is another option for her to get these off. I wanted your opinion. She wanted a referral to a plastic surgeon who would put her to sleep and remove them all.  I advised her that is not a good option. Do you have any suggestions?  One that I removed from her back showed viral wart this past visit.

## 2019-12-02 NOTE — Telephone Encounter (Signed)
Patient informed of Dr. Alveria Apley and Dr. Robbi Garter conversation about adding Cimetidine at the next visit and discussing the possibility of a chemical peel for the thinner SK's.

## 2019-12-10 DIAGNOSIS — D519 Vitamin B12 deficiency anemia, unspecified: Secondary | ICD-10-CM | POA: Diagnosis not present

## 2019-12-24 ENCOUNTER — Other Ambulatory Visit: Payer: Self-pay

## 2019-12-24 ENCOUNTER — Ambulatory Visit: Payer: PPO | Admitting: Dermatology

## 2019-12-24 DIAGNOSIS — B078 Other viral warts: Secondary | ICD-10-CM

## 2019-12-24 DIAGNOSIS — L820 Inflamed seborrheic keratosis: Secondary | ICD-10-CM

## 2019-12-24 NOTE — Progress Notes (Signed)
Follow-Up Visit   Subjective  Vanessa Clark is a 69 y.o. female who presents for the following: Warts (1 month f/u on warts on the body, treating with Urea 40% cream with a fair response ) and Seborrheic Keratosis (Irritated itchy lesions all over here body ).  She finds these seborrheic keratoses very itchy painful and these have been growing rapidly covering her entire body. The wart seems to be improved on her finger and she would like to continue immunotherapy injections for this.  The following portions of the chart were reviewed this encounter and updated as appropriate:  Tobacco  Allergies  Meds  Problems  Med Hx  Surg Hx  Fam Hx     Review of Systems:  No other skin or systemic complaints except as noted in HPI or Assessment and Plan.  Objective  Well appearing patient in no apparent distress; mood and affect are within normal limits.  A focused examination was performed including face, exts, trunk. Relevant physical exam findings are noted in the Assessment and Plan.  Objective  R middle finger: Verrucous papules -- Discussed viral etiology and contagion.   Objective  Chest, trunk, exts (61): Erythematous keratotic or waxy stuck-on papule or plaque.    Assessment & Plan  Other viral warts R middle finger  Discussed with pt we would recommend starting otc Cimetidine tablets   Destruction of lesion - R middle finger Complexity: simple   Destruction method: cryotherapy   Informed consent: discussed and consent obtained   Timeout:  patient name, date of birth, surgical site, and procedure verified Lesion destroyed using liquid nitrogen: Yes   Region frozen until ice ball extended beyond lesion: Yes   Outcome: patient tolerated procedure well with no complications   Post-procedure details: wound care instructions given    Intralesional injection - R middle finger Location: R middle finger   Informed Consent: Discussed risks (infection, pain, bleeding,  bruising, thinning of the skin, loss of skin pigment, lack of resolution, and recurrence of lesion) and benefits of the procedure, as well as the alternatives. Informed consent was obtained. Preparation: The area was prepared a standard fashion.  Procedure Details: An intralesional injection was performed with candida antigen. 1.5 cc in total were injected.  Total number of injections: 1  Plan: The patient was instructed on post-op care. Recommend OTC analgesia as needed for pain.   Inflamed seborrheic keratosis (61) -severe covering the entire body with severe symptoms.  Detailed discussion and counseling today. Chest, trunk, exts We discussed that I had reviewed her condition of multiple aggressive whole body seborrheic keratoses with Dr. Laurence Ferrari and Dr. Nicole Kindred.  Dr. Laurence Ferrari suggested that if some of these may have a viral wart component, we could consider cimetidine as noted above.  Detailed discussion with the patient.  Destruction of lesion - Chest, trunk, exts Complexity: simple   Destruction method: cryotherapy   Informed consent: discussed and consent obtained   Timeout:  patient name, date of birth, surgical site, and procedure verified Lesion destroyed using liquid nitrogen: Yes   Region frozen until ice ball extended beyond lesion: Yes   Outcome: patient tolerated procedure well with no complications   Post-procedure details: wound care instructions given    Return if symptoms worsen or fail to improve.  IMarye Round, CMA, am acting as scribe for Sarina Ser, MD .  Documentation: I have reviewed the above documentation for accuracy and completeness, and I agree with the above.  Sarina Ser, MD

## 2019-12-24 NOTE — Patient Instructions (Addendum)
Cryotherapy Aftercare  . Wash gently with soap and water everyday.   Vanessa Clark Apply Vaseline and Band-Aid daily until healed.    Recommend starting over the counter Cimetidine tablets

## 2019-12-25 ENCOUNTER — Encounter: Payer: Self-pay | Admitting: Dermatology

## 2020-01-08 DIAGNOSIS — D519 Vitamin B12 deficiency anemia, unspecified: Secondary | ICD-10-CM | POA: Diagnosis not present

## 2020-01-12 DIAGNOSIS — L4050 Arthropathic psoriasis, unspecified: Secondary | ICD-10-CM | POA: Diagnosis not present

## 2020-01-28 DIAGNOSIS — G4733 Obstructive sleep apnea (adult) (pediatric): Secondary | ICD-10-CM | POA: Diagnosis not present

## 2020-02-11 ENCOUNTER — Ambulatory Visit: Payer: PPO | Admitting: Dermatology

## 2020-02-11 ENCOUNTER — Other Ambulatory Visit: Payer: Self-pay

## 2020-02-11 DIAGNOSIS — L820 Inflamed seborrheic keratosis: Secondary | ICD-10-CM | POA: Diagnosis not present

## 2020-02-11 DIAGNOSIS — R234 Changes in skin texture: Secondary | ICD-10-CM

## 2020-02-11 DIAGNOSIS — L905 Scar conditions and fibrosis of skin: Secondary | ICD-10-CM

## 2020-02-11 DIAGNOSIS — L821 Other seborrheic keratosis: Secondary | ICD-10-CM | POA: Diagnosis not present

## 2020-02-11 DIAGNOSIS — L578 Other skin changes due to chronic exposure to nonionizing radiation: Secondary | ICD-10-CM | POA: Diagnosis not present

## 2020-02-11 DIAGNOSIS — L209 Atopic dermatitis, unspecified: Secondary | ICD-10-CM

## 2020-02-11 DIAGNOSIS — L659 Nonscarring hair loss, unspecified: Secondary | ICD-10-CM

## 2020-02-11 MED ORDER — CIMETIDINE 300 MG PO TABS: ORAL_TABLET | ORAL | 1 refills | Status: DC

## 2020-02-11 MED ORDER — MOMETASONE FUROATE 0.1 % EX OINT
TOPICAL_OINTMENT | CUTANEOUS | 0 refills | Status: AC
Start: 2020-02-11 — End: ?

## 2020-02-11 NOTE — Progress Notes (Addendum)
Follow-Up Visit   Subjective  Vanessa Clark is a 69 y.o. female who presents for the following: numerous ISK's (trunk and extremities - very itchy and irritated. Patient would like lesions treated.). She also has fissuring on her fingers that is very painful especially when she accidentally hits it. She is currently using superglue to aa's, but would like to discuss other treatment options.   The following portions of the chart were reviewed this encounter and updated as appropriate:  Tobacco  Allergies  Meds  Problems  Med Hx  Surg Hx  Fam Hx     Review of Systems:  No other skin or systemic complaints except as noted in HPI or Assessment and Plan.  Objective  Well appearing patient in no apparent distress; mood and affect are within normal limits.  A focused examination was performed including face, scalp, trunk, extremities. Relevant physical exam findings are noted in the Assessment and Plan.  Objective  R index finger: Fissuring   Objective  Trunk, extremities, face, R upper eyelid x 2, R lower eyelid x 1, >60 (60): Erythematous keratotic or waxy stuck-on papule or plaque.   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 It is often associated with allergic rhinitis and/or asthma and can require treatment with topical medications, phototherapy, or in severe cases a biologic medication called Dupixent.   Recommend liquid bandaid or similar (pt currently using superglue). Start Mometasone 0.1% ointment apply to aa's QHS PRN.   mometasone (ELOCON) 0.1 % ointment - R index finger  Inflamed seborrheic keratosis (60) Trunk, extremities, face, R upper eyelid x 2, R lower eyelid x 1, >60 Pt wanting to know if anything systemic to decrease number of new Seborrheic Keratoses.  I reviewed with other  doctors and the only thing we came up with was: Start OTC Cimetidine 300mg  po BID - discussed with patient that it may be helpful for ISK's if wart virus is involved or it may not help at all.   Destruction of lesion - Trunk, extremities, face, R upper eyelid x 2, R lower eyelid x 1, >60 Complexity: simple   Destruction method: cryotherapy   Informed consent: discussed and consent obtained   Timeout:  patient name, date of birth, surgical site, and procedure verified Lesion destroyed using liquid nitrogen: Yes   Region frozen until ice ball extended beyond lesion: Yes   Outcome: patient tolerated procedure well with no complications   Post-procedure details: wound care instructions given    cimetidine (TAGAMET) 300 MG tablet - Trunk, extremities, face, R upper eyelid x 2, R lower eyelid x 1, >60  Scar conditions and fibrosis of skin 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  Seborrheic Keratoses - multiple; chronic and new spots growing constantly - discussed Cimetidine treatment - see above (may work if viral wart component) - Stuck-on, waxy, tan-brown papules and plaques  - Discussed benign etiology and prognosis. - Observe - Call for any changes  Actinic Damage - chronic, secondary to cumulative UV radiation exposure/sun exposure over time - diffuse scaly erythematous macules with underlying dyspigmentation - Recommend daily broad spectrum sunscreen SPF 30+ to sun-exposed areas, reapply every 2 hours as needed.  - Call for new or changing lesions.  Return in about 4 weeks (around  03/10/2020) for ISK follow up .  Luther Redo, CMA, am acting as scribe for Sarina Ser, MD .  Documentation: I have reviewed the above documentation for accuracy and completeness, and I agree with the above.  Sarina Ser, MD

## 2020-02-15 ENCOUNTER — Telehealth: Payer: Self-pay

## 2020-02-15 NOTE — Telephone Encounter (Signed)
She can continue both.

## 2020-02-15 NOTE — Telephone Encounter (Signed)
Patient called regarding medications from last weeks office visit. She currently takes omeprazole but she has questioned does she need to continue while taking Cimetidine?

## 2020-02-15 NOTE — Telephone Encounter (Signed)
Left message advising patient of information per Dr. Nehemiah Massed.

## 2020-02-16 ENCOUNTER — Encounter: Payer: Self-pay | Admitting: Dermatology

## 2020-03-02 DIAGNOSIS — U071 COVID-19: Secondary | ICD-10-CM

## 2020-03-02 HISTORY — DX: COVID-19: U07.1

## 2020-03-03 DIAGNOSIS — G4733 Obstructive sleep apnea (adult) (pediatric): Secondary | ICD-10-CM | POA: Diagnosis not present

## 2020-03-06 ENCOUNTER — Other Ambulatory Visit: Payer: Self-pay | Admitting: Dermatology

## 2020-03-06 DIAGNOSIS — L820 Inflamed seborrheic keratosis: Secondary | ICD-10-CM

## 2020-03-07 DIAGNOSIS — R059 Cough, unspecified: Secondary | ICD-10-CM | POA: Diagnosis not present

## 2020-03-07 DIAGNOSIS — R300 Dysuria: Secondary | ICD-10-CM | POA: Diagnosis not present

## 2020-03-08 DIAGNOSIS — R062 Wheezing: Secondary | ICD-10-CM | POA: Diagnosis not present

## 2020-03-08 DIAGNOSIS — R059 Cough, unspecified: Secondary | ICD-10-CM | POA: Diagnosis not present

## 2020-03-08 DIAGNOSIS — R058 Other specified cough: Secondary | ICD-10-CM | POA: Diagnosis not present

## 2020-03-08 DIAGNOSIS — Z20822 Contact with and (suspected) exposure to covid-19: Secondary | ICD-10-CM | POA: Diagnosis not present

## 2020-03-08 DIAGNOSIS — R509 Fever, unspecified: Secondary | ICD-10-CM | POA: Diagnosis not present

## 2020-03-08 DIAGNOSIS — R0600 Dyspnea, unspecified: Secondary | ICD-10-CM | POA: Diagnosis not present

## 2020-03-09 DIAGNOSIS — R509 Fever, unspecified: Secondary | ICD-10-CM | POA: Diagnosis not present

## 2020-03-09 DIAGNOSIS — R059 Cough, unspecified: Secondary | ICD-10-CM | POA: Diagnosis not present

## 2020-03-09 DIAGNOSIS — Z20822 Contact with and (suspected) exposure to covid-19: Secondary | ICD-10-CM | POA: Diagnosis not present

## 2020-03-10 ENCOUNTER — Ambulatory Visit: Payer: PPO | Admitting: Dermatology

## 2020-03-22 DIAGNOSIS — E782 Mixed hyperlipidemia: Secondary | ICD-10-CM | POA: Diagnosis not present

## 2020-03-22 DIAGNOSIS — R002 Palpitations: Secondary | ICD-10-CM | POA: Diagnosis not present

## 2020-03-22 DIAGNOSIS — G4733 Obstructive sleep apnea (adult) (pediatric): Secondary | ICD-10-CM | POA: Diagnosis not present

## 2020-03-22 DIAGNOSIS — I10 Essential (primary) hypertension: Secondary | ICD-10-CM | POA: Diagnosis not present

## 2020-03-22 DIAGNOSIS — Z23 Encounter for immunization: Secondary | ICD-10-CM | POA: Diagnosis not present

## 2020-03-30 ENCOUNTER — Other Ambulatory Visit: Payer: Self-pay | Admitting: Dermatology

## 2020-03-30 DIAGNOSIS — L820 Inflamed seborrheic keratosis: Secondary | ICD-10-CM

## 2020-04-28 ENCOUNTER — Other Ambulatory Visit: Payer: Self-pay | Admitting: Dermatology

## 2020-04-28 DIAGNOSIS — L820 Inflamed seborrheic keratosis: Secondary | ICD-10-CM

## 2020-05-13 DIAGNOSIS — Z20822 Contact with and (suspected) exposure to covid-19: Secondary | ICD-10-CM | POA: Diagnosis not present

## 2020-05-23 ENCOUNTER — Other Ambulatory Visit: Payer: Self-pay | Admitting: Dermatology

## 2020-05-23 DIAGNOSIS — L820 Inflamed seborrheic keratosis: Secondary | ICD-10-CM

## 2020-06-14 DIAGNOSIS — M170 Bilateral primary osteoarthritis of knee: Secondary | ICD-10-CM | POA: Diagnosis not present

## 2020-06-14 DIAGNOSIS — M47816 Spondylosis without myelopathy or radiculopathy, lumbar region: Secondary | ICD-10-CM | POA: Diagnosis not present

## 2020-06-14 DIAGNOSIS — M159 Polyosteoarthritis, unspecified: Secondary | ICD-10-CM | POA: Diagnosis not present

## 2020-06-14 DIAGNOSIS — Z79891 Long term (current) use of opiate analgesic: Secondary | ICD-10-CM | POA: Diagnosis not present

## 2020-06-14 DIAGNOSIS — M5136 Other intervertebral disc degeneration, lumbar region: Secondary | ICD-10-CM | POA: Diagnosis not present

## 2020-06-14 DIAGNOSIS — L4050 Arthropathic psoriasis, unspecified: Secondary | ICD-10-CM | POA: Diagnosis not present

## 2020-06-14 DIAGNOSIS — Z79899 Other long term (current) drug therapy: Secondary | ICD-10-CM | POA: Diagnosis not present

## 2020-06-14 DIAGNOSIS — Z6836 Body mass index (BMI) 36.0-36.9, adult: Secondary | ICD-10-CM | POA: Diagnosis not present

## 2020-06-14 DIAGNOSIS — E6609 Other obesity due to excess calories: Secondary | ICD-10-CM | POA: Diagnosis not present

## 2020-06-14 DIAGNOSIS — M160 Bilateral primary osteoarthritis of hip: Secondary | ICD-10-CM | POA: Diagnosis not present

## 2020-06-14 DIAGNOSIS — M2578 Osteophyte, vertebrae: Secondary | ICD-10-CM | POA: Diagnosis not present

## 2020-06-14 DIAGNOSIS — Z8616 Personal history of COVID-19: Secondary | ICD-10-CM | POA: Diagnosis not present

## 2020-07-18 DIAGNOSIS — M25551 Pain in right hip: Secondary | ICD-10-CM | POA: Diagnosis not present

## 2020-07-18 DIAGNOSIS — M47816 Spondylosis without myelopathy or radiculopathy, lumbar region: Secondary | ICD-10-CM | POA: Diagnosis not present

## 2020-07-18 DIAGNOSIS — M25552 Pain in left hip: Secondary | ICD-10-CM | POA: Diagnosis not present

## 2020-07-29 DIAGNOSIS — G4733 Obstructive sleep apnea (adult) (pediatric): Secondary | ICD-10-CM | POA: Diagnosis not present

## 2020-07-29 DIAGNOSIS — I10 Essential (primary) hypertension: Secondary | ICD-10-CM | POA: Diagnosis not present

## 2020-07-29 DIAGNOSIS — E782 Mixed hyperlipidemia: Secondary | ICD-10-CM | POA: Diagnosis not present

## 2020-07-29 DIAGNOSIS — R0602 Shortness of breath: Secondary | ICD-10-CM | POA: Diagnosis not present

## 2020-08-15 DIAGNOSIS — I10 Essential (primary) hypertension: Secondary | ICD-10-CM | POA: Diagnosis not present

## 2020-08-15 DIAGNOSIS — M25552 Pain in left hip: Secondary | ICD-10-CM | POA: Diagnosis not present

## 2020-08-15 DIAGNOSIS — M545 Low back pain, unspecified: Secondary | ICD-10-CM | POA: Diagnosis not present

## 2020-08-15 DIAGNOSIS — R42 Dizziness and giddiness: Secondary | ICD-10-CM | POA: Diagnosis not present

## 2020-08-15 DIAGNOSIS — M25569 Pain in unspecified knee: Secondary | ICD-10-CM | POA: Diagnosis not present

## 2020-08-15 DIAGNOSIS — L821 Other seborrheic keratosis: Secondary | ICD-10-CM | POA: Diagnosis not present

## 2020-08-15 DIAGNOSIS — M47816 Spondylosis without myelopathy or radiculopathy, lumbar region: Secondary | ICD-10-CM | POA: Diagnosis not present

## 2020-08-15 DIAGNOSIS — M25551 Pain in right hip: Secondary | ICD-10-CM | POA: Diagnosis not present

## 2020-08-15 DIAGNOSIS — K219 Gastro-esophageal reflux disease without esophagitis: Secondary | ICD-10-CM | POA: Diagnosis not present

## 2020-08-22 DIAGNOSIS — M25552 Pain in left hip: Secondary | ICD-10-CM | POA: Diagnosis not present

## 2020-08-22 DIAGNOSIS — M25551 Pain in right hip: Secondary | ICD-10-CM | POA: Diagnosis not present

## 2020-08-22 DIAGNOSIS — M47816 Spondylosis without myelopathy or radiculopathy, lumbar region: Secondary | ICD-10-CM | POA: Diagnosis not present

## 2020-08-30 DIAGNOSIS — M25552 Pain in left hip: Secondary | ICD-10-CM | POA: Diagnosis not present

## 2020-08-30 DIAGNOSIS — M25551 Pain in right hip: Secondary | ICD-10-CM | POA: Diagnosis not present

## 2020-08-30 DIAGNOSIS — M47816 Spondylosis without myelopathy or radiculopathy, lumbar region: Secondary | ICD-10-CM | POA: Diagnosis not present

## 2020-09-06 DIAGNOSIS — M47816 Spondylosis without myelopathy or radiculopathy, lumbar region: Secondary | ICD-10-CM | POA: Diagnosis not present

## 2020-09-06 DIAGNOSIS — M25552 Pain in left hip: Secondary | ICD-10-CM | POA: Diagnosis not present

## 2020-09-06 DIAGNOSIS — M25551 Pain in right hip: Secondary | ICD-10-CM | POA: Diagnosis not present

## 2020-09-26 DIAGNOSIS — Z1231 Encounter for screening mammogram for malignant neoplasm of breast: Secondary | ICD-10-CM | POA: Diagnosis not present

## 2020-10-04 DIAGNOSIS — L821 Other seborrheic keratosis: Secondary | ICD-10-CM | POA: Diagnosis not present

## 2020-10-04 DIAGNOSIS — F411 Generalized anxiety disorder: Secondary | ICD-10-CM | POA: Diagnosis not present

## 2020-10-04 DIAGNOSIS — G894 Chronic pain syndrome: Secondary | ICD-10-CM | POA: Diagnosis not present

## 2020-10-04 DIAGNOSIS — I10 Essential (primary) hypertension: Secondary | ICD-10-CM | POA: Diagnosis not present

## 2020-10-11 ENCOUNTER — Other Ambulatory Visit: Payer: Self-pay

## 2020-10-11 NOTE — Progress Notes (Unsigned)
12  

## 2020-10-17 DIAGNOSIS — M4807 Spinal stenosis, lumbosacral region: Secondary | ICD-10-CM | POA: Diagnosis not present

## 2020-10-17 DIAGNOSIS — M545 Low back pain, unspecified: Secondary | ICD-10-CM | POA: Diagnosis not present

## 2020-10-19 ENCOUNTER — Other Ambulatory Visit: Payer: Self-pay | Admitting: Orthopedic Surgery

## 2020-10-19 DIAGNOSIS — M4807 Spinal stenosis, lumbosacral region: Secondary | ICD-10-CM

## 2020-10-20 ENCOUNTER — Other Ambulatory Visit: Payer: Self-pay | Admitting: Dermatology

## 2020-10-20 DIAGNOSIS — L820 Inflamed seborrheic keratosis: Secondary | ICD-10-CM

## 2020-10-31 ENCOUNTER — Other Ambulatory Visit: Payer: Self-pay

## 2020-10-31 ENCOUNTER — Ambulatory Visit
Admission: RE | Admit: 2020-10-31 | Discharge: 2020-10-31 | Disposition: A | Discharge status: Home / Self Care | Payer: PPO | Source: Ambulatory Visit | Attending: Orthopedic Surgery | Admitting: Orthopedic Surgery

## 2020-10-31 DIAGNOSIS — M545 Low back pain, unspecified: Secondary | ICD-10-CM | POA: Diagnosis not present

## 2020-10-31 DIAGNOSIS — M4807 Spinal stenosis, lumbosacral region: Secondary | ICD-10-CM | POA: Diagnosis not present

## 2020-11-09 DIAGNOSIS — F411 Generalized anxiety disorder: Secondary | ICD-10-CM | POA: Diagnosis not present

## 2020-11-09 DIAGNOSIS — M5136 Other intervertebral disc degeneration, lumbar region: Secondary | ICD-10-CM | POA: Diagnosis not present

## 2020-11-09 DIAGNOSIS — I10 Essential (primary) hypertension: Secondary | ICD-10-CM | POA: Diagnosis not present

## 2020-11-09 DIAGNOSIS — G894 Chronic pain syndrome: Secondary | ICD-10-CM | POA: Diagnosis not present

## 2020-11-11 DIAGNOSIS — G4733 Obstructive sleep apnea (adult) (pediatric): Secondary | ICD-10-CM | POA: Diagnosis not present

## 2020-11-29 ENCOUNTER — Encounter: Payer: Self-pay | Admitting: Dermatology

## 2020-11-29 ENCOUNTER — Ambulatory Visit: Payer: PPO | Admitting: Dermatology

## 2020-11-29 ENCOUNTER — Other Ambulatory Visit: Payer: Self-pay

## 2020-11-29 DIAGNOSIS — L820 Inflamed seborrheic keratosis: Secondary | ICD-10-CM | POA: Diagnosis not present

## 2020-11-29 DIAGNOSIS — B078 Other viral warts: Secondary | ICD-10-CM | POA: Diagnosis not present

## 2020-11-29 DIAGNOSIS — D485 Neoplasm of uncertain behavior of skin: Secondary | ICD-10-CM | POA: Diagnosis not present

## 2020-11-29 DIAGNOSIS — D492 Neoplasm of unspecified behavior of bone, soft tissue, and skin: Secondary | ICD-10-CM

## 2020-11-29 DIAGNOSIS — L700 Acne vulgaris: Secondary | ICD-10-CM | POA: Diagnosis not present

## 2020-11-29 DIAGNOSIS — L821 Other seborrheic keratosis: Secondary | ICD-10-CM

## 2020-11-29 MED ORDER — TRIAMCINOLONE ACETONIDE 0.1 % EX CREA
TOPICAL_CREAM | CUTANEOUS | 0 refills | Status: AC
Start: 2020-11-29 — End: ?

## 2020-11-29 NOTE — Progress Notes (Signed)
Follow-Up Visit   Subjective  Vanessa Clark is a 70 y.o. female who presents for the following: Seborrheic Keratosis (Pt here to have irritated itchy painful growths removed today ). Pt c/o long history of irritated skin growth all over her body, past treatment LN2 little help, Cimetidine tablets no help, pt would like a different approach to remove these areas.   The following portions of the chart were reviewed this encounter and updated as appropriate:   Tobacco  Allergies  Meds  Problems  Med Hx  Surg Hx  Fam Hx      Review of Systems:  No other skin or systemic complaints except as noted in HPI or Assessment and Plan.  Objective  Well appearing patient in no apparent distress; mood and affect are within normal limits.  A focused examination was performed including face.arms. Relevant physical exam findings are noted in the Assessment and Plan.  left post auricular x 1, right upper arm x 10, right medial thigh x 1 , right axilla x 3, left axilla x 15 (30) Erythematous keratotic or waxy stuck-on papule or plaque.   left nasal dorsum 0.4 cm pink papule      right axilla (3) Verrucous papules -- Discussed viral etiology and contagion.    Assessment & Plan  Inflamed seborrheic keratosis left post auricular x 1, right upper arm x 10, right medial thigh x 1 , right axilla x 3, left axilla x 15  Reassured benign age-related growth.  Recommend observation.  Discussed cryotherapy if spot(s) become irritated or inflamed.   Destruction of lesion - left post auricular x 1, right upper arm x 10, right medial thigh x 1 , right axilla x 3, left axilla x 15 Complexity: simple   Destruction method: cryotherapy   Informed consent: discussed and consent obtained   Timeout:  patient name, date of birth, surgical site, and procedure verified Lesion destroyed using liquid nitrogen: Yes   Region frozen until ice ball extended beyond lesion: Yes   Outcome: patient tolerated  procedure well with no complications   Post-procedure details: wound care instructions given    Related Medications triamcinolone cream (KENALOG) 0.1 % Apply to irritated itchy areas on the body, Avoid applying to face, groin, and axilla. Use as directed. Risk of skin atrophy with long-term use reviewed.  Neoplasm of skin left nasal dorsum  Skin / nail biopsy Type of biopsy: tangential   Informed consent: discussed and consent obtained   Timeout: patient name, date of birth, surgical site, and procedure verified   Procedure prep:  Patient was prepped and draped in usual sterile fashion Prep type:  Isopropyl alcohol Anesthesia: the lesion was anesthetized in a standard fashion   Anesthetic:  1% lidocaine w/ epinephrine 1-100,000 buffered w/ 8.4% NaHCO3 Instrument used: flexible razor blade   Hemostasis achieved with: aluminum chloride   Outcome: patient tolerated procedure well   Post-procedure details: wound care instructions given   Additional details:  Petrolatum and a pressure bandage applied  Specimen 1 - Surgical pathology Differential Diagnosis: R/O SCC vs scar vs sebaceous hyperplasia   Check Margins: No  R/O BCC vs Scar vs sebaceous hyperplasia   Other viral warts (3) right axilla  Discussed viral etiology and risk of spread.  Discussed multiple treatments may be required to clear warts.  Discussed possible post-treatment dyspigmentation and risk of recurrence.   Destruction of lesion - right axilla Complexity: simple   Destruction method: cryotherapy   Informed consent: discussed and consent obtained  Timeout:  patient name, date of birth, surgical site, and procedure verified Lesion destroyed using liquid nitrogen: Yes   Region frozen until ice ball extended beyond lesion: Yes   Outcome: patient tolerated procedure well with no complications   Post-procedure details: wound care instructions given    Seborrheic Keratoses - Stuck-on, waxy, tan-brown papules  and/or plaques  - Benign-appearing - Discussed benign etiology and prognosis. - Observe - Call for any changes - Can try ammonium lactate or urea cream to smooth top of lesions but advise this will not make them go away    Return in about 2 months (around 01/29/2021) for ISK.  I, Marye Round, CMA, am acting as scribe for Forest Gleason, MD .   Documentation: I have reviewed the above documentation for accuracy and completeness, and I agree with the above.  Forest Gleason, MD

## 2020-11-29 NOTE — Patient Instructions (Addendum)
Topical steroids (such as triamcinolone, fluocinolone, fluocinonide, mometasone, clobetasol, halobetasol, betamethasone, hydrocortisone) can cause thinning and lightening of the skin if they are used for too long in the same area. Your physician has selected the right strength medicine for your problem and area affected on the body. Please use your medication only as directed by your physician to prevent side effects.     Wound Care Instructions  Cleanse wound gently with soap and water once a day then pat dry with clean gauze. Apply a thing coat of Petrolatum (petroleum jelly, "Vaseline") over the wound (unless you have an allergy to this). We recommend that you use a new, sterile tube of Vaseline. Do not pick or remove scabs. Do not remove the yellow or white "healing tissue" from the base of the wound.  Cover the wound with fresh, clean, nonstick gauze and secure with paper tape. You may use Band-Aids in place of gauze and tape if the would is small enough, but would recommend trimming much of the tape off as there is often too much. Sometimes Band-Aids can irritate the skin.  You should call the office for your biopsy report after 1 week if you have not already been contacted.  If you experience any problems, such as abnormal amounts of bleeding, swelling, significant bruising, significant pain, or evidence of infection, please call the office immediately.  FOR ADULT SURGERY PATIENTS: If you need something for pain relief you may take 1 extra strength Tylenol (acetaminophen) AND 2 Ibuprofen ('200mg'$  each) together every 4 hours as needed for pain. (do not take these if you are allergic to them or if you have a reason you should not take them.) Typically, you may only need pain medication for 1 to 3 days.      Recommend OTC Gold Bond Rapid Relief Anti-Itch cream (pramoxine + menthol) up to 3 times per day to areas that are itchy.   Cerave rough and bumpy skin Amlactin rapid relief Urea 40 %  cream- [employer]    Cryotherapy Aftercare  Wash gently with soap and water everyday.   Apply Vaseline and Band-Aid daily until healed.    If you have any questions or concerns for your doctor, please call our main line at [phone] and press option 4 to reach your doctor's medical assistant. If no one answers, please leave a voicemail as directed and we will return your call as soon as possible. Messages left after 4 pm will be answered the following business day.   You may also send [place] a message via [place]. We typically respond to MyChart messages within 1-2 business days.  For prescription refills, please ask your pharmacy to contact our office. Our fax number is [phone].  If you have an urgent issue when the clinic is closed that cannot wait until the next business day, you can page your doctor at the number below.    Please note that while we do our best to be available for urgent issues outside of office hours, we are not available 24/7.   If you have an urgent issue and are unable to reach [place], you may choose to seek medical care at your doctor's office, retail clinic, urgent care center, or emergency room.  If you have a medical emergency, please immediately call 911 or go to the emergency department.  Pager Numbers  - Dr. Nehemiah Massed: [phone]  - Dr. Laurence Ferrari: [phone]  - Dr. Nicole Kindred: [phone]  In the event of inclement weather, please call our main line  at [phone] for an update on the status of any delays or closures.  Dermatology Medication Tips: Please keep the boxes that topical medications come in in order to help keep track of the instructions about where and how to use these. Pharmacies typically print the medication instructions only on the boxes and not directly on the medication tubes.   If your medication is too expensive, please contact our office at [phone] option 4 or send [place] a message through [place].   We are unable to tell what your  co-pay for medications will be in advance as this is different depending on your insurance coverage. However, we may be able to find a substitute medication at lower cost or fill out paperwork to get insurance to cover a needed medication.   If a prior authorization is required to get your medication covered by your insurance company, please allow [place] 1-2 business days to complete this process.  Drug prices often vary depending on where the prescription is filled and some pharmacies may offer cheaper prices.  The website www.goodrx.com contains coupons for medications through different pharmacies. The prices here do not account for what the cost may be with help from insurance (it may be cheaper with your insurance), but the website can give you the price if you did not use any insurance.  - You can print the associated coupon and take it with your prescription to the pharmacy.  - You may also stop by our office during regular business hours and pick up a GoodRx coupon card.  - If you need your prescription sent electronically to a different pharmacy, notify our office through [facility] or by phone at [phone] option 4.

## 2020-12-06 ENCOUNTER — Telehealth: Payer: Self-pay

## 2020-12-06 NOTE — Telephone Encounter (Addendum)
Called and informed patient of biopsy results. She verbalized understanding and denied further questions. Patient reports area is healing and not bothering her.      ----- Message from Alfonso Patten, MD sent at 12/04/2020  3:00 PM EDT ----- Skin , left nasal dorsum SURFACE OF COMEDONE --> top of an acne bump. Will recheck this area at follow-up. Patient should call if this area grows before her follow-up visit.  MAs please call. Thank you!

## 2020-12-08 DIAGNOSIS — G8929 Other chronic pain: Secondary | ICD-10-CM | POA: Diagnosis not present

## 2020-12-08 DIAGNOSIS — M5442 Lumbago with sciatica, left side: Secondary | ICD-10-CM | POA: Diagnosis not present

## 2020-12-08 DIAGNOSIS — M5441 Lumbago with sciatica, right side: Secondary | ICD-10-CM | POA: Diagnosis not present

## 2020-12-12 ENCOUNTER — Other Ambulatory Visit (INDEPENDENT_AMBULATORY_CARE_PROVIDER_SITE_OTHER): Payer: Self-pay

## 2020-12-12 ENCOUNTER — Other Ambulatory Visit: Payer: Self-pay

## 2020-12-12 ENCOUNTER — Telehealth: Payer: Self-pay

## 2020-12-12 DIAGNOSIS — Z86010 Personal history of colonic polyps: Secondary | ICD-10-CM

## 2020-12-12 MED ORDER — PEG 3350-KCL-NA BICARB-NACL 420 G PO SOLR: 4000.0000 mL | Freq: Once | ORAL | 0 refills | Status: AC

## 2020-12-12 MED ORDER — CLENPIQ 10-3.5-12 MG-GM -GM/160ML PO SOLN: 1.0000 | Freq: Once | ORAL | 0 refills | Status: AC

## 2020-12-12 NOTE — Telephone Encounter (Signed)
Pt. Calling back about prep. She said the price of the prep is too high she needs another one called in

## 2020-12-12 NOTE — Telephone Encounter (Signed)
Called to speak with pharmacy regarding prep. Rep states patient does not have insurance to use the coupon. cheaper prep was sent to pharmacy.

## 2020-12-12 NOTE — Progress Notes (Signed)
New prep order has been sent. Clenpiq too expensive for patient.

## 2020-12-12 NOTE — Progress Notes (Signed)
Gastroenterology Pre-Procedure Review  Request Date: 01/23/21 Requesting Physician: Dr. Allen Norris  PATIENT REVIEW QUESTIONS: The patient responded to the following health history questions as indicated:    1. Are you having any GI issues? no 2. 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 yes (last colonoscopy 5+ years ago) 3. Do you have a family history of Colon Cancer or Polyps? no 4. Diabetes Mellitus? no 5. Joint replacements in the past 12 months?no 6. Major health problems in the past 3 months?no 7. Any artificial heart valves, MVP, or defibrillator?no    MEDICATIONS & ALLERGIES:    Patient reports the following regarding taking any anticoagulation/antiplatelet therapy:   Plavix, Coumadin, Eliquis, Xarelto, Lovenox, Pradaxa, Brilinta, or Effient? no Aspirin? no  Patient confirms/reports the following medications:  Current Outpatient Medications  Medication Sig Dispense Refill   Adalimumab (HUMIRA [place]) Inject into the skin.     ALPRAZolam (XANAX) 0.5 MG tablet Take 0.5 mg by mouth.     betamethasone dipropionate (DIPROLENE) 0.05 % cream APPLY TO AFFECTED AREA TWICE A DAY  2   loratadine (CLARITIN) 10 MG tablet Frequency:PRN   Dosage:0.0     Instructions:  Note:Dose: UNKNOWN     meloxicam (MOBIC) 15 MG tablet Take 15 mg by mouth.     methylphenidate 10 MG ER tablet      mometasone (ELOCON) 0.1 % ointment Apply to fissures in fingers at night. 45 g 0   NEOMYCIN-POLYMYXIN-HYDROCORTISONE (CORTISPORIN) 1 % SOLN OTIC solution Apply 1-2 drops to toe BID after soaking 10 mL 1   omeprazole (PRILOSEC) 20 MG capsule Take 20 mg by mouth.     Probiotic Product (PROBIOTIC DAILY PO) Take by mouth daily.     triamcinolone cream (KENALOG) 0.1 % Apply to irritated itchy areas on the body, Avoid applying to face, groin, and axilla. Use as directed. Risk of skin atrophy with long-term use reviewed. 454 g 0   venlafaxine XR (EFFEXOR-XR) 75 MG 24 hr capsule Take 75 mg by mouth.     No current  facility-administered medications for this visit.    Patient confirms/reports the following allergies:  Allergies  Allergen Reactions   Sulfacetamide Sodium Other (See Comments)    Not effective   Sulfa Antibiotics Other (See Comments)    Not effective    No orders of the defined types were placed in this encounter.   AUTHORIZATION INFORMATION Primary Insurance: 1D#: Group #:  Secondary Insurance: 1D#: Group #:  SCHEDULE INFORMATION: Date: 01/23/21 Time: Location: [place]

## 2020-12-26 DIAGNOSIS — M5451 Vertebrogenic low back pain: Secondary | ICD-10-CM | POA: Diagnosis not present

## 2020-12-26 DIAGNOSIS — M6281 Muscle weakness (generalized): Secondary | ICD-10-CM | POA: Diagnosis not present

## 2020-12-27 DIAGNOSIS — Z6837 Body mass index (BMI) 37.0-37.9, adult: Secondary | ICD-10-CM | POA: Diagnosis not present

## 2020-12-27 DIAGNOSIS — M1990 Unspecified osteoarthritis, unspecified site: Secondary | ICD-10-CM | POA: Diagnosis not present

## 2020-12-27 DIAGNOSIS — M545 Low back pain, unspecified: Secondary | ICD-10-CM | POA: Diagnosis not present

## 2020-12-27 DIAGNOSIS — L4050 Arthropathic psoriasis, unspecified: Secondary | ICD-10-CM | POA: Diagnosis not present

## 2020-12-27 DIAGNOSIS — G8929 Other chronic pain: Secondary | ICD-10-CM | POA: Diagnosis not present

## 2020-12-29 DIAGNOSIS — M6281 Muscle weakness (generalized): Secondary | ICD-10-CM | POA: Diagnosis not present

## 2020-12-29 DIAGNOSIS — M5451 Vertebrogenic low back pain: Secondary | ICD-10-CM | POA: Diagnosis not present

## 2021-01-02 DIAGNOSIS — M5451 Vertebrogenic low back pain: Secondary | ICD-10-CM | POA: Diagnosis not present

## 2021-01-02 DIAGNOSIS — M6281 Muscle weakness (generalized): Secondary | ICD-10-CM | POA: Diagnosis not present

## 2021-01-05 ENCOUNTER — Encounter: Payer: Self-pay | Admitting: Gastroenterology

## 2021-01-05 DIAGNOSIS — M6281 Muscle weakness (generalized): Secondary | ICD-10-CM | POA: Diagnosis not present

## 2021-01-05 DIAGNOSIS — M5451 Vertebrogenic low back pain: Secondary | ICD-10-CM | POA: Diagnosis not present

## 2021-01-06 DIAGNOSIS — R7309 Other abnormal glucose: Secondary | ICD-10-CM | POA: Diagnosis not present

## 2021-01-06 DIAGNOSIS — Z6836 Body mass index (BMI) 36.0-36.9, adult: Secondary | ICD-10-CM | POA: Diagnosis not present

## 2021-01-06 DIAGNOSIS — L4059 Other psoriatic arthropathy: Secondary | ICD-10-CM | POA: Diagnosis not present

## 2021-01-06 DIAGNOSIS — D519 Vitamin B12 deficiency anemia, unspecified: Secondary | ICD-10-CM | POA: Diagnosis not present

## 2021-01-06 DIAGNOSIS — I10 Essential (primary) hypertension: Secondary | ICD-10-CM | POA: Diagnosis not present

## 2021-01-06 DIAGNOSIS — G894 Chronic pain syndrome: Secondary | ICD-10-CM | POA: Diagnosis not present

## 2021-01-10 DIAGNOSIS — M6281 Muscle weakness (generalized): Secondary | ICD-10-CM | POA: Diagnosis not present

## 2021-01-10 DIAGNOSIS — M5451 Vertebrogenic low back pain: Secondary | ICD-10-CM | POA: Diagnosis not present

## 2021-01-17 DIAGNOSIS — M5451 Vertebrogenic low back pain: Secondary | ICD-10-CM | POA: Diagnosis not present

## 2021-01-17 DIAGNOSIS — M6281 Muscle weakness (generalized): Secondary | ICD-10-CM | POA: Diagnosis not present

## 2021-01-19 DIAGNOSIS — M6281 Muscle weakness (generalized): Secondary | ICD-10-CM | POA: Diagnosis not present

## 2021-01-19 DIAGNOSIS — M5451 Vertebrogenic low back pain: Secondary | ICD-10-CM | POA: Diagnosis not present

## 2021-01-23 ENCOUNTER — Ambulatory Visit
Admission: RE | Admit: 2021-01-23 | Discharge: 2021-01-23 | Disposition: A | Discharge status: Home / Self Care | Payer: PPO | Attending: Gastroenterology | Admitting: Gastroenterology

## 2021-01-23 ENCOUNTER — Encounter: Payer: Self-pay | Admitting: Gastroenterology

## 2021-01-23 ENCOUNTER — Other Ambulatory Visit: Payer: Self-pay

## 2021-01-23 ENCOUNTER — Encounter
Admission: RE | Disposition: A | Discharge status: Home / Self Care | Payer: Self-pay | Source: Home / Self Care | Attending: Gastroenterology

## 2021-01-23 ENCOUNTER — Ambulatory Visit: Payer: PPO | Admitting: Anesthesiology

## 2021-01-23 DIAGNOSIS — K635 Polyp of colon: Secondary | ICD-10-CM

## 2021-01-23 DIAGNOSIS — Z86010 Personal history of colonic polyps: Secondary | ICD-10-CM | POA: Diagnosis not present

## 2021-01-23 DIAGNOSIS — Z882 Allergy status to sulfonamides status: Secondary | ICD-10-CM | POA: Diagnosis not present

## 2021-01-23 DIAGNOSIS — Z1211 Encounter for screening for malignant neoplasm of colon: Secondary | ICD-10-CM | POA: Diagnosis not present

## 2021-01-23 DIAGNOSIS — Z79899 Other long term (current) drug therapy: Secondary | ICD-10-CM | POA: Diagnosis not present

## 2021-01-23 DIAGNOSIS — D125 Benign neoplasm of sigmoid colon: Secondary | ICD-10-CM | POA: Insufficient documentation

## 2021-01-23 DIAGNOSIS — D126 Benign neoplasm of colon, unspecified: Secondary | ICD-10-CM | POA: Diagnosis not present

## 2021-01-23 DIAGNOSIS — Z8616 Personal history of COVID-19: Secondary | ICD-10-CM | POA: Insufficient documentation

## 2021-01-23 DIAGNOSIS — L4050 Arthropathic psoriasis, unspecified: Secondary | ICD-10-CM | POA: Insufficient documentation

## 2021-01-23 DIAGNOSIS — F17210 Nicotine dependence, cigarettes, uncomplicated: Secondary | ICD-10-CM | POA: Insufficient documentation

## 2021-01-23 HISTORY — PX: POLYPECTOMY: SHX5525

## 2021-01-23 HISTORY — DX: Sleep apnea, unspecified: G47.30

## 2021-01-23 HISTORY — DX: Arthropathic psoriasis, unspecified: L40.50

## 2021-01-23 HISTORY — DX: Spinal stenosis, site unspecified: M48.00

## 2021-01-23 HISTORY — DX: Essential (primary) hypertension: I10

## 2021-01-23 HISTORY — DX: Dizziness and giddiness: R42

## 2021-01-23 HISTORY — PX: COLONOSCOPY WITH PROPOFOL: SHX5780

## 2021-01-23 SURGERY — COLONOSCOPY WITH PROPOFOL
Anesthesia: General | Site: Rectum

## 2021-01-23 MED ORDER — SODIUM CHLORIDE 0.9 % IV SOLN: INTRAVENOUS | Status: DC

## 2021-01-23 MED ORDER — LIDOCAINE HCL (CARDIAC) PF 100 MG/5ML IV SOSY
PREFILLED_SYRINGE | INTRAVENOUS | Status: DC | PRN
Start: 2021-01-23 — End: 2021-01-23
  Administered 2021-01-23: 50 mg via INTRAVENOUS

## 2021-01-23 MED ORDER — LACTATED RINGERS IV SOLN: INTRAVENOUS | Status: DC

## 2021-01-23 MED ORDER — PROPOFOL 10 MG/ML IV BOLUS
INTRAVENOUS | Status: DC | PRN
  Administered 2021-01-23 (×5): 20 mg via INTRAVENOUS
  Administered 2021-01-23: 80 mg via INTRAVENOUS
  Administered 2021-01-23 (×2): 20 mg via INTRAVENOUS

## 2021-01-23 MED ORDER — STERILE WATER FOR IRRIGATION IR SOLN
Status: DC | PRN
  Administered 2021-01-23: 1

## 2021-01-23 SURGICAL SUPPLY — 22 items
CLIP HMST 235XBRD CATH ROT (MISCELLANEOUS) IMPLANT
CLIP RESOLUTION 360 11X235 (MISCELLANEOUS)
ELECT REM PT RETURN 9FT ADLT (ELECTROSURGICAL)
ELECTRODE REM PT RTRN 9FT ADLT (ELECTROSURGICAL) IMPLANT
FORCEPS BIOP RAD 4 LRG CAP 4 (CUTTING FORCEPS) ×2 IMPLANT
GOWN CVR UNV OPN BCK APRN NK (MISCELLANEOUS) ×2 IMPLANT
GOWN ISOL THUMB LOOP REG UNIV (MISCELLANEOUS) ×4
INJECTOR VARIJECT VIN23 (MISCELLANEOUS) IMPLANT
KIT DEFENDO VALVE AND CONN (KITS) IMPLANT
KIT PRC NS LF DISP ENDO (KITS) ×1 IMPLANT
KIT PROCEDURE OLYMPUS (KITS) ×2
MANIFOLD NEPTUNE II (INSTRUMENTS) ×2 IMPLANT
MARKER SPOT ENDO TATTOO 5ML (MISCELLANEOUS) IMPLANT
PROBE APC STR FIRE (PROBE) IMPLANT
RETRIEVER NET ROTH 2.5X230 LF (MISCELLANEOUS) IMPLANT
SNARE COLD EXACTO (MISCELLANEOUS) IMPLANT
SNARE SHORT THROW 13M SML OVAL (MISCELLANEOUS) IMPLANT
SNARE SNG USE RND 15MM (INSTRUMENTS) IMPLANT
SPOT EX ENDOSCOPIC TATTOO (MISCELLANEOUS)
TRAP ETRAP POLY (MISCELLANEOUS) IMPLANT
VARIJECT INJECTOR VIN23 (MISCELLANEOUS)
WATER STERILE IRR 250ML POUR (IV SOLUTION) ×2 IMPLANT

## 2021-01-23 NOTE — Transfer of Care (Signed)
Immediate Anesthesia Transfer of Care Note  Patient: Vanessa Clark  Procedure(s) Performed: COLONOSCOPY WITH PROPOFOL (Rectum) POLYPECTOMY (Rectum)  Patient Location: PACU  Anesthesia Type: General  Level of Consciousness: awake, alert  and patient cooperative  Airway and Oxygen Therapy: Patient Spontanous Breathing and Patient connected to supplemental oxygen  Post-op Assessment: Post-op Vital signs reviewed, Patient's Cardiovascular Status Stable, Respiratory Function Stable, Patent Airway and No signs of Nausea or vomiting  Post-op Vital Signs: Reviewed and stable  Complications: No notable events documented.

## 2021-01-23 NOTE — Anesthesia Preprocedure Evaluation (Signed)
Anesthesia Evaluation  Patient identified by MRN, date of birth, ID band Patient awake    History of Anesthesia Complications Negative for: history of anesthetic complications  Airway Mallampati: III  TM Distance: >3 FB Neck ROM: Full    Dental no notable dental hx.    Pulmonary sleep apnea , Current Smoker (down to 1-2 cigarettes/day) and Patient abstained from smoking.,    Pulmonary exam normal        Cardiovascular Exercise Tolerance: Good hypertension, Pt. on medications negative cardio ROS Normal cardiovascular exam     Neuro/Psych negative neurological ROS     GI/Hepatic Neg liver ROS, GERD  Medicated,  Endo/Other  BMI 36  Renal/GU negative Renal ROS     Musculoskeletal  (+) Arthritis  (psoriatic arthritis),   Abdominal   Peds  Hematology negative hematology ROS (+)   Anesthesia Other Findings   Reproductive/Obstetrics                             Anesthesia Physical Anesthesia Plan  ASA: 3  Anesthesia Plan: General   Post-op Pain Management:    Induction: Intravenous  PONV Risk Score and Plan: 2 and Propofol infusion, TIVA and Treatment may vary due to age or medical condition  Airway Management Planned: Nasal Cannula and Natural Airway  Additional Equipment: None  Intra-op Plan:   Post-operative Plan:   Informed Consent: I have reviewed the patients History and Physical, chart, labs and discussed the procedure including the risks, benefits and alternatives for the proposed anesthesia with the patient or authorized representative who has indicated his/her understanding and acceptance.       Plan Discussed with: CRNA  Anesthesia Plan Comments:         Anesthesia Quick Evaluation

## 2021-01-23 NOTE — Op Note (Signed)
[facility] Gastroenterology Patient Name: Vanessa Clark Procedure Date: 01/23/2021 12:44 PM MRN: [id number] Account #: [id number] Date of Birth: 29-May-1950 Admit Type: Outpatient Age: 70 Room: [facility] OR ROOM 01 Gender: Female Note Status: Finalized Instrument Name: [id number] Procedure:             Colonoscopy Indications:           Screening for colorectal malignant neoplasm Providers:             Lucilla Lame MD, MD Referring MD:          Lynnell Jude (Referring MD) Medicines:             Propofol per Anesthesia Complications:         No immediate complications. Procedure:             Pre-Anesthesia Assessment:                        - Prior to the procedure, a History and Physical was                         performed, and patient medications and allergies were                         reviewed. The patient's tolerance of previous                         anesthesia was also reviewed. The risks and benefits                         of the procedure and the sedation options and risks                         were discussed with the patient. All questions were                         answered, and informed consent was obtained. Prior                         Anticoagulants: The patient has taken no previous                         anticoagulant or antiplatelet agents. ASA Grade                         Assessment: II - A patient with mild systemic disease.                         After reviewing the risks and benefits, the patient                         was deemed in satisfactory condition to undergo the                         procedure.                        After obtaining informed consent, the colonoscope was  passed under direct vision. Throughout the procedure,                         the patient's blood pressure, pulse, and oxygen                         saturations were monitored continuously. The was                          introduced through the anus and advanced to the the                         cecum, identified by appendiceal orifice and ileocecal                         valve. The colonoscopy was performed without                         difficulty. The patient tolerated the procedure well.                         The quality of the bowel preparation was fair. Findings:      The perianal and digital rectal examinations were normal.      Two sessile polyps were found in the sigmoid colon. The polyps were 3 to       4 mm in size. These polyps were removed with a cold biopsy forceps.       Resection and retrieval were complete. Impression:            - Preparation of the colon was fair.                        - Two 3 to 4 mm polyps in the sigmoid colon, removed                         with a cold biopsy forceps. Resected and retrieved. Recommendation:        - Discharge patient to home.                        - Resume previous diet.                        - Continue present medications.                        - Await pathology results.                        - Repeat colonoscopy in 3 years for surveillance due                         to prep being fair. Procedure Code(s):     --- Professional ---                        [phone], Colonoscopy, flexible; with biopsy, single or                         multiple Diagnosis Code(s):     ---  Professional ---                        Z12.11, Encounter for screening for malignant neoplasm                         of colon                        K63.5, Polyp of colon CPT copyright 2019 American Medical Association. All rights reserved. The codes documented in this report are preliminary and upon coder review may  be revised to meet current compliance requirements. Lucilla Lame MD, MD 01/23/2021 1:12:56 PM This report has been signed electronically. Number of Addenda: 0 Note Initiated On: 01/23/2021 12:44 PM Scope Withdrawal Time: 0 hours 6 minutes 45 seconds  Total  Procedure Duration: 0 hours 13 minutes 13 seconds  Estimated Blood Loss:  Estimated blood loss: none.      [facility]

## 2021-01-23 NOTE — Anesthesia Procedure Notes (Signed)
Date/Time: 01/23/2021 12:54 PM Performed by: Mayme Genta, CRNA Pre-anesthesia Checklist: Patient identified, Emergency Drugs available, Suction available, Timeout performed and Patient being monitored Patient Re-evaluated:Patient Re-evaluated prior to induction Oxygen Delivery Method: Nasal cannula Placement Confirmation: positive ETCO2

## 2021-01-23 NOTE — Anesthesia Postprocedure Evaluation (Signed)
Anesthesia Post Note  Patient: Vanessa Clark  Procedure(s) Performed: COLONOSCOPY WITH PROPOFOL (Rectum) POLYPECTOMY (Rectum)     Patient location during evaluation: PACU Anesthesia Type: General Level of consciousness: awake and alert Pain management: pain level controlled Vital Signs Assessment: post-procedure vital signs reviewed and stable Respiratory status: spontaneous breathing, nonlabored ventilation, respiratory function stable and patient connected to nasal cannula oxygen Cardiovascular status: blood pressure returned to baseline and stable Postop Assessment: no apparent nausea or vomiting Anesthetic complications: no   No notable events documented.  Adele Barthel [place]

## 2021-01-23 NOTE — H&P (Signed)
Lucilla Lame, MD [address] [place], [place] [address] Phone: [phone] Fax : [phone]  Primary Care Physician:  Lynnell Jude, MD Primary Gastroenterologist:  Dr. Allen Norris  Pre-Procedure History & Physical: HPI:  Vanessa Clark is a 70 y.o. female is here for a screening colonoscopy.   Past Medical History:  Diagnosis Date   COVID-19 03/2020   Hypertension    Psoriatic arthritis ([place])    Sleep apnea    CPAP   Spinal stenosis    Vertigo     Past Surgical History:  Procedure Laterality Date   ABDOMINAL HYSTERECTOMY     HAND SURGERY     NASAL SINUS SURGERY      Prior to Admission medications   Medication Sig Start Date End Date Taking? Authorizing Provider  Adalimumab (HUMIRA [place]) Inject into the skin.   Yes [provider]  amLODipine (NORVASC) 5 MG tablet Take 5 mg by mouth daily.   Yes [provider]  betamethasone dipropionate (DIPROLENE) 0.05 % cream APPLY TO AFFECTED AREA TWICE A DAY 10/15/17  Yes [provider]  cannabidiol (EPIDIOLEX) 100 MG/ML solution Take by mouth 2 (two) times daily.   Yes [provider]  cetirizine (ZYRTEC) 10 MG tablet Take 10 mg by mouth daily.   Yes [provider]  chlorthalidone (HYGROTON) 25 MG tablet Take 25 mg by mouth daily.   Yes [provider]  DULoxetine (CYMBALTA) 60 MG capsule Take 60 mg by mouth daily.   Yes [provider]  gabapentin (NEURONTIN) 300 MG capsule Take 300 mg by mouth 3 (three) times daily.   Yes [provider]  HYDROcodone-acetaminophen (NORCO/VICODIN) 5-325 MG tablet Take 1 tablet by mouth every 6 (six) hours as needed for moderate pain.   Yes [provider]  loratadine (CLARITIN) 10 MG tablet Frequency:PRN   Dosage:0.0     Instructions:  Note:Dose: UNKNOWN 03/30/11  Yes [provider]  meloxicam (MOBIC) 15 MG tablet Take 15 mg by mouth. 01/21/14  Yes [provider]  mometasone (ELOCON)  0.1 % ointment Apply to fissures in fingers at night. 02/11/20  Yes Ralene Bathe, MD  omeprazole (PRILOSEC) 20 MG capsule Take 20 mg by mouth. 12/22/13  Yes [provider]  Probiotic Product (PROBIOTIC DAILY PO) Take by mouth daily.   Yes [provider]  triamcinolone cream (KENALOG) 0.1 % Apply to irritated itchy areas on the body, Avoid applying to face, groin, and axilla. Use as directed. Risk of skin atrophy with long-term use reviewed. 11/29/20  Yes Moye, [place], MD  valsartan (DIOVAN) 80 MG tablet Take 80 mg by mouth daily.   Yes [provider]  NEOMYCIN-POLYMYXIN-HYDROCORTISONE (CORTISPORIN) 1 % SOLN OTIC solution Apply 1-2 drops to toe BID after soaking Patient not taking: Reported on 01/05/2021 12/18/17   Tyson Dense T, DPM    Allergies as of 12/12/2020 - Review Complete 12/12/2020  Allergen Reaction Noted   Sulfacetamide sodium Other (See Comments) 08/17/2014   Sulfa antibiotics Other (See Comments) 04/08/2014    History reviewed. No pertinent family history.  Social History   Socioeconomic History   Marital status: Married    Spouse name: Not on file   Number of children: Not on file   Years of education: Not on file   Highest education level: Not on file  Occupational History   Not on file  Tobacco Use   Smoking status: Some Days    Packs/day: 0.10    Years:  40.00    Pack years: 4.00    Types: Cigarettes   Smokeless tobacco: Never   Tobacco comments:    Starting smoking in her 54s.  Cut back to 1-2 cigs some days since COVID 12/21  Vaping Use   Vaping Use: Never used  Substance and Sexual Activity   Alcohol use: No    Alcohol/week: 0.0 standard drinks   Drug use: Not on file   Sexual activity: Not on file  Other Topics Concern   Not on file  Social History Narrative   Not on file   Social Determinants of Health   Financial Resource Strain: Not on file  Food Insecurity: Not on file  Transportation Needs: Not on file   Physical Activity: Not on file  Stress: Not on file  Social Connections: Not on file  Intimate Partner Violence: Not on file    Review of Systems: See HPI, otherwise negative ROS  Physical Exam: BP 136/70   Pulse 72   Temp (!) 97.2 F (36.2 C) (Temporal)   Ht 5\' 3"  (1.6 m)   Wt 92.3 kg   SpO2 96%   BMI 36.05 kg/m  General:   Alert,  pleasant and cooperative in NAD Head:  Normocephalic and atraumatic. Neck:  Supple; no masses or thyromegaly. Lungs:  Clear throughout to auscultation.    Heart:  Regular rate and rhythm. Abdomen:  Soft, nontender and nondistended. Normal bowel sounds, without guarding, and without rebound.   Neurologic:  Alert and  oriented x4;  grossly normal neurologically.  Impression/Plan: Vanessa Clark is now here to undergo a screening colonoscopy.  Risks, benefits, and alternatives regarding colonoscopy have been reviewed with the patient.  Questions have been answered.  All parties agreeable.

## 2021-01-25 ENCOUNTER — Encounter: Payer: Self-pay | Admitting: Gastroenterology

## 2021-01-25 DIAGNOSIS — M5451 Vertebrogenic low back pain: Secondary | ICD-10-CM | POA: Diagnosis not present

## 2021-01-25 DIAGNOSIS — M6281 Muscle weakness (generalized): Secondary | ICD-10-CM | POA: Diagnosis not present

## 2021-01-25 LAB — SURGICAL PATHOLOGY

## 2021-01-26 ENCOUNTER — Other Ambulatory Visit: Payer: Self-pay

## 2021-01-26 ENCOUNTER — Encounter: Payer: Self-pay | Admitting: Gastroenterology

## 2021-01-26 ENCOUNTER — Ambulatory Visit: Payer: PPO | Admitting: Dermatology

## 2021-01-26 DIAGNOSIS — L821 Other seborrheic keratosis: Secondary | ICD-10-CM | POA: Diagnosis not present

## 2021-01-26 DIAGNOSIS — L304 Erythema intertrigo: Secondary | ICD-10-CM

## 2021-01-26 DIAGNOSIS — L720 Epidermal cyst: Secondary | ICD-10-CM | POA: Diagnosis not present

## 2021-01-26 DIAGNOSIS — L820 Inflamed seborrheic keratosis: Secondary | ICD-10-CM

## 2021-01-26 MED ORDER — HYDROCORTISONE 2.5 % EX CREA: TOPICAL_CREAM | CUTANEOUS | 0 refills | Status: AC

## 2021-01-26 MED ORDER — KETOCONAZOLE 2 % EX CREA
TOPICAL_CREAM | CUTANEOUS | 0 refills | Status: AC
Start: 2021-01-26 — End: ?

## 2021-01-26 NOTE — Progress Notes (Signed)
Follow-Up Visit   Subjective  Vanessa Clark is a 70 y.o. female who presents for the following: ISK recheck  (Scattered on face, trunk, and extremities. Patient is having back issues today and doesn't want to treat a lot of spots with LN2. She would like instructions on how to use the [facility] 0.1% cream because she no longer has them. ). She also has rash under breasts that is bothersome.  The following portions of the chart were reviewed this encounter and updated as appropriate:   Tobacco  Allergies  Meds  Problems  Med Hx  Surg Hx  Fam Hx      Review of Systems:  No other skin or systemic complaints except as noted in HPI or Assessment and Plan.  Objective  Well appearing patient in no apparent distress; mood and affect are within normal limits.  A focused examination was performed including the face, trunk, and arms. Relevant physical exam findings are noted in the Assessment and Plan.  R flank x 4 Erythematous keratotic or waxy stuck-on papule or plaque.   Inside the nose White papules.  Inframammary Erythematous slightly macerated patches   Assessment & Plan  Inflamed seborrheic keratosis R flank x 4  Symptomatic  Prior to procedure, discussed risks of blister formation, small wound, skin dyspigmentation, or rare scar following cryotherapy. Recommend Vaseline ointment to treated areas while healing.  Start TMC BID up to two weeks to itchy areas. Avoid applying to face, groin, and axilla. Use as directed. Long-term use can cause thinning of the skin.  Topical steroids (such as triamcinolone, fluocinolone, fluocinonide, mometasone, clobetasol, halobetasol, betamethasone, hydrocortisone) can cause thinning and lightening of the skin if they are used for too long in the same area. Your physician has selected the right strength medicine for your problem and area affected on the body. Please use your medication only as directed by your physician to prevent side effects.      Destruction of lesion - R flank x 4 Complexity: simple   Destruction method: cryotherapy   Informed consent: discussed and consent obtained   Timeout:  patient name, date of birth, surgical site, and procedure verified Lesion destroyed using liquid nitrogen: Yes   Region frozen until ice ball extended beyond lesion: Yes   Outcome: patient tolerated procedure well with no complications   Post-procedure details: wound care instructions given    Related Medications triamcinolone cream (KENALOG) 0.1 % Apply to irritated itchy areas on the body, Avoid applying to face, groin, and axilla. Use as directed. Risk of skin atrophy with long-term use reviewed.  [place] Inside the nose  Benign-appearing. Discussed extraction but advised of small risk of infection inside the nose. Patient declines treatment today.   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 Condition is bothersome to patient. Currently flared.  Intertrigo is a chronic recurrent rash that occurs in skin fold areas that may be associated with friction; heat; moisture; yeast; fungus; and bacteria.  It is exacerbated by increased movement / activity; sweating; and higher atmospheric temperature.  Start HC 2.5% cream to aa's BID prn up to two weeks, and Ketoconazole 2% cream BID PRN.   Recommend daily zeasorb AF powder to areas where rash occurs to help prevent recurrence  hydrocortisone 2.5 % cream - Inframammary Apply to rash under the breast twice daily up to two weeks.  ketoconazole (NIZORAL) 2 % cream - Inframammary Apply to the rash under the breast twice daily as needed.  Seborrheic Keratoses - Stuck-on, waxy, tan-brown papules and/or plaques  - Benign-appearing - Discussed benign etiology and prognosis. - Observe - Call for any changes  Return in about 3 months (around 04/28/2021) for ISK recheck .  Luther Redo, CMA, am acting as scribe for Forest Gleason, MD .  Documentation: I have reviewed the above documentation for accuracy and completeness, and I agree with the above.  Forest Gleason, MD

## 2021-01-26 NOTE — Patient Instructions (Addendum)
Start Triamcinolone 0.1% cream to itchy areas on the body and extremities twice a day up to two weeks. Avoid the face, groin, and axilla. Topical steroids (such as triamcinolone, fluocinolone, fluocinonide, mometasone, clobetasol, halobetasol, betamethasone, hydrocortisone) can cause thinning and lightening of the skin if they are used for too long in the same area. Your physician has selected the right strength medicine for your problem and area affected on the body. Please use your medication only as directed by your physician to prevent side effects.   For rash under the breast start Hydrocortisone 2.5% cream to affected areas twice daily up to two weeks, and Ketoconazole 2% cream twice daily as needed.   Recommend [place] Anti-Itch Rapid Relief cream OTC PRN itch.   If you have any questions or concerns for your doctor, please call our main line at [phone] and press option 4 to reach your doctor's medical assistant. If no one answers, please leave a voicemail as directed and we will return your call as soon as possible. Messages left after 4 pm will be answered the following business day.   You may also send [place] a message via [place]. We typically respond to MyChart messages within 1-2 business days.  For prescription refills, please ask your pharmacy to contact our office. Our fax number is [phone].  If you have an urgent issue when the clinic is closed that cannot wait until the next business day, you can page your doctor at the number below.    Please note that while we do our best to be available for urgent issues outside of office hours, we are not available 24/7.   If you have an urgent issue and are unable to reach [place], you may choose to seek medical care at your doctor's office, retail clinic, urgent care center, or emergency room.  If you have a medical emergency, please immediately call 911 or go to the emergency department.  Pager Numbers  - Dr. Nehemiah Massed:  [phone]  - Dr. Laurence Ferrari: [phone]  - Dr. Nicole Kindred: [phone]  In the event of inclement weather, please call our main line at [phone] for an update on the status of any delays or closures.  Dermatology Medication Tips: Please keep the boxes that topical medications come in in order to help keep track of the instructions about where and how to use these. Pharmacies typically print the medication instructions only on the boxes and not directly on the medication tubes.   If your medication is too expensive, please contact our office at [phone] option 4 or send [place] a message through [place].   We are unable to tell what your co-pay for medications will be in advance as this is different depending on your insurance coverage. However, we may be able to find a substitute medication at lower cost or fill out paperwork to get insurance to cover a needed medication.   If a prior authorization is required to get your medication covered by your insurance company, please allow [place] 1-2 business days to complete this process.  Drug prices often vary depending on where the prescription is filled and some pharmacies may offer cheaper prices.  The website www.goodrx.com contains coupons for medications through different pharmacies. The prices here do not account for what the cost may be with help from insurance (it may be cheaper with your insurance), but the website can give you the price if you did not use any insurance.  - You can print the associated coupon and take it  with your prescription to the pharmacy.  - You may also stop by our office during regular business hours and pick up a GoodRx coupon card.  - If you need your prescription sent electronically to a different pharmacy, notify our office through [facility] or by phone at [phone] option 4.

## 2021-02-02 DIAGNOSIS — Z23 Encounter for immunization: Secondary | ICD-10-CM | POA: Diagnosis not present

## 2021-02-02 DIAGNOSIS — D519 Vitamin B12 deficiency anemia, unspecified: Secondary | ICD-10-CM | POA: Diagnosis not present

## 2021-02-08 ENCOUNTER — Encounter: Payer: Self-pay | Admitting: Dermatology

## 2021-02-09 DIAGNOSIS — G8929 Other chronic pain: Secondary | ICD-10-CM | POA: Diagnosis not present

## 2021-02-09 DIAGNOSIS — M7138 Other bursal cyst, other site: Secondary | ICD-10-CM | POA: Diagnosis not present

## 2021-02-09 DIAGNOSIS — M5441 Lumbago with sciatica, right side: Secondary | ICD-10-CM | POA: Diagnosis not present

## 2021-02-09 DIAGNOSIS — M5442 Lumbago with sciatica, left side: Secondary | ICD-10-CM | POA: Diagnosis not present

## 2021-02-09 DIAGNOSIS — G4733 Obstructive sleep apnea (adult) (pediatric): Secondary | ICD-10-CM | POA: Diagnosis not present

## 2021-02-15 DIAGNOSIS — H2513 Age-related nuclear cataract, bilateral: Secondary | ICD-10-CM | POA: Diagnosis not present

## 2021-02-27 DIAGNOSIS — G4733 Obstructive sleep apnea (adult) (pediatric): Secondary | ICD-10-CM | POA: Diagnosis not present

## 2021-03-08 DIAGNOSIS — Z6836 Body mass index (BMI) 36.0-36.9, adult: Secondary | ICD-10-CM | POA: Diagnosis not present

## 2021-03-08 DIAGNOSIS — M545 Low back pain, unspecified: Secondary | ICD-10-CM | POA: Diagnosis not present

## 2021-03-08 DIAGNOSIS — G894 Chronic pain syndrome: Secondary | ICD-10-CM | POA: Diagnosis not present

## 2021-03-08 DIAGNOSIS — Z79899 Other long term (current) drug therapy: Secondary | ICD-10-CM | POA: Diagnosis not present

## 2021-03-08 DIAGNOSIS — D519 Vitamin B12 deficiency anemia, unspecified: Secondary | ICD-10-CM | POA: Diagnosis not present

## 2021-03-15 ENCOUNTER — Ambulatory Visit: Payer: PPO | Admitting: Pain Medicine

## 2021-04-04 ENCOUNTER — Other Ambulatory Visit: Payer: Self-pay

## 2021-04-04 ENCOUNTER — Encounter: Payer: Self-pay | Admitting: Dermatology

## 2021-04-04 ENCOUNTER — Ambulatory Visit: Payer: PPO | Admitting: Dermatology

## 2021-04-04 DIAGNOSIS — L304 Erythema intertrigo: Secondary | ICD-10-CM

## 2021-04-04 DIAGNOSIS — L814 Other melanin hyperpigmentation: Secondary | ICD-10-CM

## 2021-04-04 DIAGNOSIS — L821 Other seborrheic keratosis: Secondary | ICD-10-CM

## 2021-04-04 DIAGNOSIS — L820 Inflamed seborrheic keratosis: Secondary | ICD-10-CM

## 2021-04-04 NOTE — Patient Instructions (Addendum)
Cryotherapy Aftercare  Wash gently with soap and water everyday.   Apply Vaseline and Band-Aid daily until healed.   Prior to procedure, discussed risks of blister formation, small wound, skin dyspigmentation, or rare scar following cryotherapy. Recommend Vaseline ointment to treated areas while healing.   For Rash Under Breast and at [facility] Recommend daily zeasorb AF powder to areas where rash occurs to help prevent recurrence  If rash flares up, start HC 2.5% cream to aa's BID prn up to two weeks, and Ketoconazole 2% cream BID PRN.     Do not use triamcinolone to face or folds   If You Need Anything After Your Visit  If you have any questions or concerns for your doctor, please call our main line at [phone] and press option 4 to reach your doctor's medical assistant. If no one answers, please leave a voicemail as directed and we will return your call as soon as possible. Messages left after 4 pm will be answered the following business day.   You may also send [place] a message via [place]. We typically respond to MyChart messages within 1-2 business days.  For prescription refills, please ask your pharmacy to contact our office. Our fax number is [phone].  If you have an urgent issue when the clinic is closed that cannot wait until the next business day, you can page your doctor at the number below.    Please note that while we do our best to be available for urgent issues outside of office hours, we are not available 24/7.   If you have an urgent issue and are unable to reach [place], you may choose to seek medical care at your doctor's office, retail clinic, urgent care center, or emergency room.  If you have a medical emergency, please immediately call 911 or go to the emergency department.  Pager Numbers  - Dr. Nehemiah Massed: [phone]  - Dr. Laurence Ferrari: [phone]  - Dr. Nicole Kindred: [phone]  In the event of inclement weather, please call our main line at [phone] for an  update on the status of any delays or closures.  Dermatology Medication Tips: Please keep the boxes that topical medications come in in order to help keep track of the instructions about where and how to use these. Pharmacies typically print the medication instructions only on the boxes and not directly on the medication tubes.   If your medication is too expensive, please contact our office at [phone] option 4 or send [place] a message through [place].   We are unable to tell what your co-pay for medications will be in advance as this is different depending on your insurance coverage. However, we may be able to find a substitute medication at lower cost or fill out paperwork to get insurance to cover a needed medication.   If a prior authorization is required to get your medication covered by your insurance company, please allow [place] 1-2 business days to complete this process.  Drug prices often vary depending on where the prescription is filled and some pharmacies may offer cheaper prices.  The website www.goodrx.com contains coupons for medications through different pharmacies. The prices here do not account for what the cost may be with help from insurance (it may be cheaper with your insurance), but the website can give you the price if you did not use any insurance.  - You can print the associated coupon and take it with your prescription to the pharmacy.  - You may also stop by our office  during regular business hours and pick up a GoodRx coupon card.  - If you need your prescription sent electronically to a different pharmacy, notify our office through [facility] or by phone at [phone] option 4.     Si Usted Necesita Algo Despus de Su Visita  Tambin puede enviarnos un mensaje a travs de Pharmacist, community. Por lo general respondemos a los mensajes de MyChart en el transcurso de 1 a 2 das hbiles.  Para renovar recetas, por favor pida a su farmacia que se ponga en contacto con  nuestra oficina. Harland Dingwall de fax es [place] [phone].  Si tiene un asunto urgente cuando la clnica est cerrada y que no puede esperar hasta el siguiente da hbil, puede llamar/localizar a su doctor(a) al nmero que aparece a continuacin.   Por favor, tenga en cuenta que aunque hacemos todo lo posible para estar disponibles para asuntos urgentes fuera del horario de [place], no estamos disponibles las 24 horas del da, los 7 das de la [place].   Si tiene un problema urgente y no puede comunicarse con nosotros, puede optar por buscar atencin mdica  en el consultorio de su doctor(a), en una clnica privada, en un centro de atencin urgente o en una sala de emergencias.  Si tiene Engineering geologist, por favor llame inmediatamente al 911 o vaya a la sala de emergencias.  Nmeros de bper  - Dr. Nehemiah Massed: [phone]  - Dra. Moye: [phone]  - Dra. Nicole Kindred: [phone]  En caso de inclemencias del [place], por favor llame a Johnsie Kindred principal al [phone] para una actualizacin sobre el [place] de cualquier retraso o cierre.  Consejos para la medicacin en dermatologa: Por favor, guarde las cajas en las que vienen los medicamentos de uso tpico para ayudarle a seguir las instrucciones sobre dnde y cmo usarlos. Las farmacias generalmente imprimen las instrucciones del medicamento slo en las cajas y no directamente en los tubos del [place].   Si su medicamento es muy caro, por favor, pngase en contacto con Zigmund Daniel llamando al [phone] y presione la opcin 4 o envenos un mensaje a travs de Pharmacist, community.   No podemos decirle cul ser su copago por los medicamentos por adelantado ya que esto es diferente dependiendo de la cobertura de su seguro. Sin embargo, es posible que podamos encontrar un medicamento sustituto a Electrical engineer un formulario para que el seguro cubra el medicamento que se considera necesario.   Si se requiere una autorizacin  previa para que su compaa de seguros [place] su medicamento, por favor permtanos de 1 a 2 das hbiles para completar este proceso.  Los precios de los medicamentos varan con frecuencia dependiendo del Environmental consultant de dnde se surte la receta y alguna farmacias pueden ofrecer precios ms baratos.  El sitio web www.goodrx.com tiene cupones para medicamentos de Airline pilot. Los precios aqu no tienen en cuenta lo que podra costar con la ayuda del seguro (puede ser ms barato con su seguro), pero el sitio web puede darle el precio si no utiliz Research scientist (physical sciences).  - Puede imprimir el cupn correspondiente y llevarlo con su receta a la farmacia.  - Tambin puede pasar por nuestra oficina durante el horario de atencin regular y Charity fundraiser una tarjeta de cupones de GoodRx.  - Si necesita que su receta se enve electrnicamente a una farmacia diferente, informe a nuestra oficina a travs de MyChart de [place] o por telfono llamando al [phone] y presione la opcin 4.

## 2021-04-04 NOTE — Progress Notes (Signed)
Follow-Up Visit   Subjective  Vanessa Clark is a 71 y.o. female who presents for the following: Follow-up (Patient here today for 2 month ISK follow up. Areas at right flank treated with LN2 at last visit. ).  Patient also was given a rx for TMC 0.1% cream but does not use it that often. She does have some other itchy areas she would like treated.   The following portions of the chart were reviewed this encounter and updated as appropriate:   Tobacco   Allergies   Meds   Problems   Med Hx   Surg Hx   Fam Hx       Review of Systems:  No other skin or systemic complaints except as noted in HPI or Assessment and Plan.  Objective  Well appearing patient in no apparent distress; mood and affect are within normal limits.  A focused examination was performed including arms, neck, chest, trunk. Relevant physical exam findings are noted in the Assessment and Plan.  left arm x 4, left breast x 1, neck x 18, right flank x 6, left flank x 2, right arm x 4 (35) Erythematous stuck-on, waxy papule or plaque  Left Inframammary Fold clear    Assessment & Plan  Inflamed seborrheic keratosis (35) left arm x 4, left breast x 1, neck x 18, right flank x 6, left flank x 2, right arm x 4  Symptomatic, very itchy  Prior to procedure, discussed risks of blister formation, small wound, skin dyspigmentation, or rare scar following cryotherapy. Recommend Vaseline ointment to treated areas while healing.   Destruction of lesion - left arm x 4, left breast x 1, neck x 18, right flank x 6, left flank x 2, right arm x 4  Destruction method: cryotherapy   Informed consent: discussed and consent obtained   Lesion destroyed using liquid nitrogen: Yes   Cryotherapy cycles:  2 Outcome: patient tolerated procedure well with no complications   Post-procedure details: wound care instructions given    Related Medications triamcinolone cream (KENALOG) 0.1 % Apply to irritated itchy areas on the body,  Avoid applying to face, groin, and axilla. Use as directed. Risk of skin atrophy with long-term use reviewed.  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 Currently well-controlled but she has been using triamcinolone under breasts.   Recommend daily zeasorb AF powder to areas where rash occurs to help prevent recurrence  If rash flares up, start HC 2.5% cream to aa's BID prn up to two weeks, and Ketoconazole 2% cream BID PRN.     Do not use triamcinolone to face or folds      Related Medications hydrocortisone 2.5 % cream Apply to rash under the breast twice daily up to two weeks.  ketoconazole (NIZORAL) 2 % cream Apply to the rash under the breast twice daily as needed.   Seborrheic Keratoses - Stuck-on, waxy, tan-brown papules and/or plaques  - Benign-appearing - Discussed benign etiology and prognosis. - Observe - Call for any changes  Lentigines - Scattered tan macules - Due to sun exposure - Benign-appering, observe - Recommend daily broad spectrum sunscreen SPF 30+ to sun-exposed areas, reapply every 2 hours as needed. - Call for any changes  Return for ISK follow up.  Graciella Belton, RMA, am acting as scribe for Forest Gleason, MD .  Documentation: I have reviewed the above documentation for accuracy and completeness, and I agree with the above.  Forest Gleason, MD

## 2021-05-17 ENCOUNTER — Ambulatory Visit: Payer: PPO | Admitting: Dermatology

## 2021-05-17 ENCOUNTER — Other Ambulatory Visit: Payer: Self-pay

## 2021-05-17 DIAGNOSIS — L820 Inflamed seborrheic keratosis: Secondary | ICD-10-CM

## 2021-05-17 DIAGNOSIS — B079 Viral wart, unspecified: Secondary | ICD-10-CM | POA: Diagnosis not present

## 2021-05-17 NOTE — Progress Notes (Signed)
° °  Follow-Up Visit   Subjective  Vanessa Clark is a 71 y.o. female who presents for the following: Follow-up (Patient here today for follow up on isk. Patient reports she has several more isk she would like treated today ).  Right leg x 16, left leg x 21, right flank x 3 right back x 5, right upper arm x   The following portions of the chart were reviewed this encounter and updated as appropriate:  Tobacco   Allergies   Meds   Problems   Med Hx   Surg Hx   Fam Hx       Review of Systems: No other skin or systemic complaints except as noted in HPI or Assessment and Plan.   Objective  Well appearing patient in no apparent distress; mood and affect are within normal limits.  A focused examination was performed including scalp, legs, arms, trunk. Relevant physical exam findings are noted in the Assessment and Plan.  left scalp x 1, right vertex scalp x 1 Verrucous papules -- Discussed viral etiology and contagion.   right leg x 16, left leg x21, right flank x 3, right back x 5, right upper arm x 3, left upper arm x 6, left forearm x 2, right forearm x 1 (50) Erythematous stuck-on, waxy papule or plaque   Assessment & Plan  Verruca left scalp x 1, right vertex scalp x 1  Discussed viral etiology and risk of spread.  Discussed multiple treatments may be required to clear warts.  Discussed possible post-treatment dyspigmentation and risk of recurrence.   Destruction of lesion - left scalp x 1, right vertex scalp x 1  Destruction method: cryotherapy   Informed consent: discussed and consent obtained   Lesion destroyed using liquid nitrogen: Yes   Cryotherapy cycles:  2 Outcome: patient tolerated procedure well with no complications   Post-procedure details: wound care instructions given   Additional details:  Prior to procedure, discussed risks of blister formation, small wound, skin dyspigmentation, or rare scar following cryotherapy. Recommend Vaseline ointment to treated areas  while healing.   Inflamed seborrheic keratosis (50) right leg x 16, left leg x21, right flank x 3, right back x 5, right upper arm x 3, left upper arm x 6, left forearm x 2, right forearm x 1  Irritated and bothering patient  Destruction of lesion - right leg x 16, left leg x21, right flank x 3, right back x 5, right upper arm x 3, left upper arm x 6, left forearm x 2, right forearm x 1  Destruction method: cryotherapy   Informed consent: discussed and consent obtained   Lesion destroyed using liquid nitrogen: Yes   Cryotherapy cycles:  2 Outcome: patient tolerated procedure well with no complications   Post-procedure details: wound care instructions given   Additional details:  Prior to procedure, discussed risks of blister formation, small wound, skin dyspigmentation, or rare scar following cryotherapy. Recommend Vaseline ointment to treated areas while healing.   Related Medications triamcinolone cream (KENALOG) 0.1 % Apply to irritated itchy areas on the body, Avoid applying to face, groin, and axilla. Use as directed. Risk of skin atrophy with long-term use reviewed.   Return for 1 - 2 month isk follow up. I, Ruthell Rummage, CMA, am acting as scribe for Forest Gleason, MD.  Documentation: I have reviewed the above documentation for accuracy and completeness, and I agree with the above.  Forest Gleason, MD

## 2021-05-17 NOTE — Patient Instructions (Signed)
Seborrheic Keratosis ? ?What causes seborrheic keratoses? ?Seborrheic keratoses are harmless, common skin growths that first appear during adult life.  As time goes by, more growths appear.  Some people may develop a large number of them.  Seborrheic keratoses appear on both covered and uncovered body parts.  They are not caused by sunlight.  The tendency to develop seborrheic keratoses can be inherited.  They vary in color from skin-colored to gray, brown, or even black.  They can be either smooth or have a rough, warty surface.   ?Seborrheic keratoses are superficial and look as if they were stuck on the skin.  Under the microscope this type of keratosis looks like layers upon layers of skin.  That is why at times the top layer may seem to fall off, but the rest of the growth remains and re-grows.   ? ?Treatment ?Seborrheic keratoses do not need to be treated, but can easily be removed in the office.  Seborrheic keratoses often cause symptoms when they rub on clothing or jewelry.  Lesions can be in the way of shaving.  If they become inflamed, they can cause itching, soreness, or burning.  Removal of a seborrheic keratosis can be accomplished by freezing, burning, or surgery. ?If any spot bleeds, scabs, or grows rapidly, please return to have it checked, as these can be an indication of a skin cancer. ? ? ?Cryotherapy Aftercare ? ?Wash gently with soap and water everyday.   ?Apply Vaseline and Band-Aid daily until healed.  ? ? ?If You Need Anything After Your Visit ? ?If you have any questions or concerns for your doctor, please call our main line at [phone] and press option 4 to reach your doctor's medical assistant. If no one answers, please leave a voicemail as directed and we will return your call as soon as possible. Messages left after 4 pm will be answered the following business day.  ? ?You may also send us a message via MyChart. We typically respond to MyChart messages within 1-2 business  days. ? ?For prescription refills, please ask your pharmacy to contact our office. Our fax number is [phone]. ? ?If you have an urgent issue when the clinic is closed that cannot wait until the next business day, you can page your doctor at the number below.   ? ?Please note that while we do our best to be available for urgent issues outside of office hours, we are not available 24/7.  ? ?If you have an urgent issue and are unable to reach us, you may choose to seek medical care at your doctor's office, retail clinic, urgent care center, or emergency room. ? ?If you have a medical emergency, please immediately call 911 or go to the emergency department. ? ?Pager Numbers ? ?- Dr. Kowalski: [phone] ? ?- Dr. Moye: [phone] ? ?- Dr. Stewart: [phone] ? ?In the event of inclement weather, please call our main line at [phone] for an update on the status of any delays or closures. ? ?Dermatology Medication Tips: ?Please keep the boxes that topical medications come in in order to help keep track of the instructions about where and how to use these. Pharmacies typically print the medication instructions only on the boxes and not directly on the medication tubes.  ? ?If your medication is too expensive, please contact our office at [phone] option 4 or send us a message through MyChart.  ? ?We are unable to tell what your co-pay for medications will be in advance   as this is different depending on your insurance coverage. However, we may be able to find a substitute medication at lower cost or fill out paperwork to get insurance to cover a needed medication.  ? ?If a prior authorization is required to get your medication covered by your insurance company, please allow us 1-2 business days to complete this process. ? ?Drug prices often vary depending on where the prescription is filled and some pharmacies may offer cheaper prices. ? ?The website www.goodrx.com contains coupons for medications through  different pharmacies. The prices here do not account for what the cost may be with help from insurance (it may be cheaper with your insurance), but the website can give you the price if you did not use any insurance.  ?- You can print the associated coupon and take it with your prescription to the pharmacy.  ?- You may also stop by our office during regular business hours and pick up a GoodRx coupon card.  ?- If you need your prescription sent electronically to a different pharmacy, notify our office through [place] MyChart or by phone at [phone] option 4. ? ? ? ? ?Si Usted Necesita Algo Despu?s de Su Visita ? ?Tambi?n puede enviarnos un mensaje a trav?s de MyChart. Por lo general respondemos a los mensajes de MyChart en el transcurso de 1 a 2 d?as h?biles. ? ?Para renovar recetas, por favor pida a su farmacia que se ponga en contacto con nuestra oficina. Nuestro n?mero de fax es el [phone]. ? ?Si tiene un asunto urgente cuando la cl?nica est? cerrada y que no puede esperar hasta el siguiente d?a h?bil, puede llamar/localizar a su doctor(a) al n?mero que aparece a continuaci?n.  ? ?Por favor, tenga en cuenta que aunque hacemos todo lo posible para estar disponibles para asuntos urgentes fuera del horario de oficina, no estamos disponibles las 24 horas del d?a, los 7 d?as de la semana.  ? ?Si tiene un problema urgente y no puede comunicarse con nosotros, puede optar por buscar atenci?n m?dica  en el consultorio de su doctor(a), en una cl?nica privada, en un centro de atenci?n urgente o en una sala de emergencias. ? ?Si tiene una emergencia m?dica, por favor llame inmediatamente al 911 o vaya a la sala de emergencias. ? ?N?meros de b?per ? ?- Dr. Kowalski: [phone] ? ?- Dra. Moye: [phone] ? ?- Dra. Stewart: [phone] ? ?En caso de inclemencias del tiempo, por favor llame a nuestra l?nea principal al [phone] para una actualizaci?n sobre el estado de cualquier retraso o cierre. ? ?Consejos  para la medicaci?n en dermatolog?a: ?Por favor, guarde las cajas en las que vienen los medicamentos de uso t?pico para ayudarle a seguir las instrucciones sobre d?nde y c?mo usarlos. Las farmacias generalmente imprimen las instrucciones del medicamento s?lo en las cajas y no directamente en los tubos del medicamento.  ? ?Si su medicamento es muy caro, por favor, p?ngase en contacto con nuestra oficina llamando al [phone] y presione la opci?n 4 o env?enos un mensaje a trav?s de MyChart.  ? ?No podemos decirle cu?l ser? su copago por los medicamentos por adelantado ya que esto es diferente dependiendo de la cobertura de su seguro. Sin embargo, es posible que podamos encontrar un medicamento sustituto a menor costo o llenar un formulario para que el seguro cubra el medicamento que se considera necesario.  ? ?Si se requiere una autorizaci?n previa para que su compa??a de seguros cubra su medicamento, por favor perm?tanos de 1 a 2 d?as   h?biles para completar este proceso. ? ?Los precios de los medicamentos var?an con frecuencia dependiendo del lugar de d?nde se surte la receta y alguna farmacias pueden ofrecer precios m?s baratos. ? ?El sitio web www.goodrx.com tiene cupones para medicamentos de diferentes farmacias. Los precios aqu? no tienen en cuenta lo que podr?a costar con la ayuda del seguro (puede ser m?s barato con su seguro), pero el sitio web puede darle el precio si no utiliz? ning?n seguro.  ?- Puede imprimir el cup?n correspondiente y llevarlo con su receta a la farmacia.  ?- Tambi?n puede pasar por nuestra oficina durante el horario de atenci?n regular y recoger una tarjeta de cupones de GoodRx.  ?- Si necesita que su receta se env?e electr?nicamente a una farmacia diferente, informe a nuestra oficina a trav?s de MyChart de  o por tel?fono llamando al [phone] y presione la opci?n 4. ? ?

## 2021-05-25 ENCOUNTER — Encounter: Payer: Self-pay | Admitting: Dermatology

## 2021-06-28 ENCOUNTER — Ambulatory Visit: Payer: PPO | Admitting: Dermatology

## 2021-06-28 ENCOUNTER — Other Ambulatory Visit: Payer: Self-pay

## 2021-06-28 DIAGNOSIS — L820 Inflamed seborrheic keratosis: Secondary | ICD-10-CM | POA: Diagnosis not present

## 2021-06-28 DIAGNOSIS — D100 Benign neoplasm of lip: Secondary | ICD-10-CM

## 2021-06-28 DIAGNOSIS — L578 Other skin changes due to chronic exposure to nonionizing radiation: Secondary | ICD-10-CM

## 2021-06-28 DIAGNOSIS — D489 Neoplasm of uncertain behavior, unspecified: Secondary | ICD-10-CM

## 2021-06-28 DIAGNOSIS — D219 Benign neoplasm of connective and other soft tissue, unspecified: Secondary | ICD-10-CM

## 2021-06-28 DIAGNOSIS — D485 Neoplasm of uncertain behavior of skin: Secondary | ICD-10-CM | POA: Diagnosis not present

## 2021-06-28 DIAGNOSIS — L821 Other seborrheic keratosis: Secondary | ICD-10-CM | POA: Diagnosis not present

## 2021-06-28 NOTE — Progress Notes (Signed)
? ?Follow-Up Visit ?  ?Subjective  ?Anyjah Roundtree is a 71 y.o. female who presents for the following: Follow-up (Patient here today for follow up on isks at multiple locations at body. She reports some new areas at face, ears, scalp, bump inside lower lip she would like checked. She has history of isks removed at arms, legs, back and history of warts at left and right scalp. ).  Spots are itchy and irritated and she tends to pick at them. ? ? ?The patient has spots, moles and lesions to be evaluated, some may be new or changing and the patient has concerns that these could be cancer. ? ? ?The following portions of the chart were reviewed this encounter and updated as appropriate:   ?  ? ?Review of Systems: No other skin or systemic complaints except as noted in HPI or Assessment and Plan. ? ? ?Objective  ?Well appearing patient in no apparent distress; mood and affect are within normal limits. ? ?A focused examination was performed including left arm, face, scalp, ears, nose. Relevant physical exam findings are noted in the Assessment and Plan. ? ?left postauricular x 2, left temporal hairline x 8, left preauricular x 1, left neck x 3, right sideburn x 1, right temporal hairline x 1, right postauricular x 1, right forearm x 1 (19) ?Erythematous stuck-on, waxy papules ? ?left nasal dorusm ?5 mm pink yellow lobulated papule with central dilated pore ? ? ? ? ? ?labial mucosa of lower lip ?0.6 cm firm flesh papule of inside mucosal lower lip ? ? ?Assessment & Plan  ?Inflamed seborrheic keratosis (19) ?left postauricular x 2, left temporal hairline x 8, left preauricular x 1, left neck x 3, right sideburn x 1, right temporal hairline x 1, right postauricular x 1, right forearm x 1 ? ?Irritated and bothers patient ? ? ?Destruction of lesion - left postauricular x 2, left temporal hairline x 8, left preauricular x 1, left neck x 3, right sideburn x 1, right temporal hairline x 1, right postauricular x 1, right forearm  x 1 ? ?Destruction method: cryotherapy   ?Informed consent: discussed and consent obtained   ?Lesion destroyed using liquid nitrogen: Yes   ?Region frozen until ice ball extended beyond lesion: Yes   ?Outcome: patient tolerated procedure well with no complications   ?Post-procedure details: wound care instructions given   ?Additional details:  Prior to procedure, discussed risks of blister formation, small wound, skin dyspigmentation, or rare scar following cryotherapy. Recommend Vaseline ointment to treated areas while healing. ? ? ?Related Medications ?triamcinolone cream (KENALOG) 0.1 % ?Apply to irritated itchy areas on the body, Avoid applying to face, groin, and axilla. Use as directed. Risk of skin atrophy with long-term use reviewed. ? ?Neoplasm of uncertain behavior ?left nasal dorusm ? ?Epidermal / dermal shaving ? ?Lesion diameter (cm):  0.5 ?Informed consent: discussed and consent obtained   ?Patient was prepped and draped in usual sterile fashion: Area prepped with alcohol. ?Anesthesia: the lesion was anesthetized in a standard fashion   ?Anesthetic:  1% lidocaine w/ epinephrine 1-100,000 buffered w/ 8.4% NaHCO3 ?Instrument used: flexible razor blade   ?Hemostasis achieved with: pressure, aluminum chloride and electrodesiccation   ?Outcome: patient tolerated procedure well   ?Post-procedure details: wound care instructions given   ?Post-procedure details comment:  Ointment and small bandage applied.  ? ?Specimen 1 - Surgical pathology ?Differential Diagnosis: R/o sebaceous hyperplasia vs bcc  ? ?Check Margins: No ? ?R/o sebaceous hyperplasia vs bcc  ? ? ? ?  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 ?Mucosal Fibroma ? ?Patient is bothered by bump, symptomatic  ? ?Recommend shave removal in surgery slot ? ? ? ? ? ?Seborrheic Keratoses ?- Stuck-on, waxy, tan-brown papules and/or plaques  ?- Benign-appearing ?- Discussed benign etiology and prognosis. ?- Observe ?- Call for any changes ? ?Actinic Damage ?-  chronic, secondary to cumulative UV radiation exposure/sun exposure over time ?- diffuse scaly erythematous macules with underlying dyspigmentation ?- Recommend daily broad spectrum sunscreen SPF 30+ to sun-exposed areas, reapply every 2 hours as needed.  ?- Recommend staying in the shade or wearing long sleeves, sun glasses (UVA+UVB protection) and wide brim hats (4-inch brim around the entire circumference of the hat). ?- Call for new or changing lesions. ? ?Return for surgery for fibroma at lower lip, 6 week isk follow up. ?I, Ruthell Rummage, CMA, am acting as scribe for Brendolyn Margarete, MD. ? ?Documentation: I have reviewed the above documentation for accuracy and completeness, and I agree with the above. ? ?Brendolyn Earnie MD  ? ?

## 2021-06-28 NOTE — Patient Instructions (Addendum)
Biopsy Wound Care Instructions ? ?Leave the original bandage on for 24 hours if possible.  If the bandage becomes soaked or soiled before that time, it is OK to remove it and examine the wound.  A small amount of post-operative bleeding is normal.  If excessive bleeding occurs, remove the bandage, place gauze over the site and apply continuous pressure (no peeking) over the area for 30 minutes. If this does not work, please call our clinic as soon as possible or page your doctor if it is after hours.  ? ?Once a day, cleanse the wound with soap and water. It is fine to shower. If a thick crust develops you may use a Q-tip dipped into dilute hydrogen peroxide (mix 1:1 with water) to dissolve it.  Hydrogen peroxide can slow the healing process, so use it only as needed.   ? ?After washing, apply petroleum jelly (Vaseline) or an antibiotic ointment if your doctor prescribed one for you, followed by a bandage.   ? ?For best healing, the wound should be covered with a layer of ointment at all times. If you are not able to keep the area covered with a bandage to hold the ointment in place, this may mean re-applying the ointment several times a day.  Continue this wound care until the wound has healed and is no longer open.  ? ?Itching and mild discomfort is normal during the healing process. However, if you develop pain or severe itching, please call our office.  ? ?If you have any discomfort, you can take Tylenol (acetaminophen) or ibuprofen as directed on the bottle. (Please do not take these if you have an allergy to them or cannot take them for another reason). ? ?Some redness, tenderness and white or yellow material in the wound is normal healing.  If the area becomes very sore and red, or develops a thick yellow-green material (pus), it may be infected; please notify us.   ? ?If you have stitches, return to clinic as directed to have the stitches removed. You will continue wound care for 2-3 days after the stitches  are removed.  ? ?Wound healing continues for up to one year following surgery. It is not unusual to experience pain in the scar from time to time during the interval.  If the pain becomes severe or the scar thickens, you should notify the office.   ? ?A slight amount of redness in a scar is expected for the first six months.  After six months, the redness will fade and the scar will soften and fade.  The color difference becomes less noticeable with time.  If there are any problems, return for a post-op surgery check at your earliest convenience. ? ?To improve the appearance of the scar, you can use silicone scar gel, cream, or sheets (such as Mederma or Serica) every night for up to one year. These are available over the counter (without a prescription). ? ?Please call our office at [phone] for any questions or concerns. ? ?Seborrheic Keratosis ? ?What causes seborrheic keratoses? ?Seborrheic keratoses are harmless, common skin growths that first appear during adult life.  As time goes by, more growths appear.  Some people may develop a large number of them.  Seborrheic keratoses appear on both covered and uncovered body parts.  They are not caused by sunlight.  The tendency to develop seborrheic keratoses can be inherited.  They vary in color from skin-colored to gray, brown, or even black.  They can be either  smooth or have a rough, warty surface.   ?Seborrheic keratoses are superficial and look as if they were stuck on the skin.  Under the microscope this type of keratosis looks like layers upon layers of skin.  That is why at times the top layer may seem to fall off, but the rest of the growth remains and re-grows.   ? ?Treatment ?Seborrheic keratoses do not need to be treated, but can easily be removed in the office.  Seborrheic keratoses often cause symptoms when they rub on clothing or jewelry.  Lesions can be in the way of shaving.  If they become inflamed, they can cause itching, soreness, or  burning.  Removal of a seborrheic keratosis can be accomplished by freezing, burning, or surgery. ?If any spot bleeds, scabs, or grows rapidly, please return to have it checked, as these can be an indication of a skin cancer. ? ? ?Cryotherapy Aftercare ? ?Wash gently with soap and water everyday.   ?Apply Vaseline and Band-Aid daily until healed.  ? ?If You Need Anything After Your Visit ? ?If you have any questions or concerns for your doctor, please call our main line at [phone] and press option 4 to reach your doctor's medical assistant. If no one answers, please leave a voicemail as directed and we will return your call as soon as possible. Messages left after 4 pm will be answered the following business day.  ? ?You may also send [place] a message via MyChart. We typically respond to MyChart messages within 1-2 business days. ? ?For prescription refills, please ask your pharmacy to contact our office. Our fax number is [phone]. ? ?If you have an urgent issue when the clinic is closed that cannot wait until the next business day, you can page your doctor at the number below.   ? ?Please note that while we do our best to be available for urgent issues outside of office hours, we are not available 24/7.  ? ?If you have an urgent issue and are unable to reach [place], you may choose to seek medical care at your doctor's office, retail clinic, urgent care center, or emergency room. ? ?If you have a medical emergency, please immediately call 911 or go to the emergency department. ? ?Pager Numbers ? ?- Dr. Nehemiah Massed: [phone] ? ?- Dr. Laurence Ferrari: [phone] ? ?- Dr. Nicole Kindred: [phone] ? ?In the event of inclement weather, please call our main line at [phone] for an update on the status of any delays or closures. ? ?Dermatology Medication Tips: ?Please keep the boxes that topical medications come in in order to help keep track of the instructions about where and how to use these. Pharmacies typically print the  medication instructions only on the boxes and not directly on the medication tubes.  ? ?If your medication is too expensive, please contact our office at [phone] option 4 or send [place] a message through [place].  ? ?We are unable to tell what your co-pay for medications will be in advance as this is different depending on your insurance coverage. However, we may be able to find a substitute medication at lower cost or fill out paperwork to get insurance to cover a needed medication.  ? ?If a prior authorization is required to get your medication covered by your insurance company, please allow [place] 1-2 business days to complete this process. ? ?Drug prices often vary depending on where the prescription is filled and some pharmacies may offer cheaper prices. ? ?The website www.goodrx.com  contains coupons for medications through different pharmacies. The prices here do not account for what the cost may be with help from insurance (it may be cheaper with your insurance), but the website can give you the price if you did not use any insurance.  ?- You can print the associated coupon and take it with your prescription to the pharmacy.  ?- You may also stop by our office during regular business hours and pick up a GoodRx coupon card.  ?- If you need your prescription sent electronically to a different pharmacy, notify our office through [facility] or by phone at [phone] option 4. ? ? ? ? ?Si Usted Necesita Algo Despu?s de Su Visita ? ?Tambi?n puede enviarnos un mensaje a trav?s de MyChart. Por lo general respondemos a los mensajes de MyChart en el transcurso de 1 a 2 d?as h?biles. ? ?Para renovar recetas, por favor pida a su farmacia que se ponga en contacto con nuestra oficina. Nuestro n?mero de fax es el [phone]. ? ?Si tiene un asunto urgente cuando la cl?nica est? cerrada y que no puede esperar hasta el siguiente d?a h?bil, puede llamar/localizar a su doctor(a) al n?mero que aparece a continuaci?n.   ? ?Por favor, tenga en cuenta que aunque hacemos todo lo posible para estar disponibles para asuntos urgentes fuera del horario de oficina, no estamos disponibles las 24 horas del d?a, los 7 d?as de la semana.

## 2021-06-29 ENCOUNTER — Telehealth: Payer: Self-pay

## 2021-06-29 NOTE — Telephone Encounter (Signed)
-----   Message from Brendolyn Digna, MD sent at 06/29/2021  3:17 PM EDT ----- ?Skin , left nasal dorsum ?SEBACEOUS GLAND HYPERPLASIA ? ?Benign enlarged oil gland  ? ?- please call patient ?

## 2021-06-29 NOTE — Telephone Encounter (Signed)
Advised pt of bx results/sh ?

## 2021-07-31 ENCOUNTER — Ambulatory Visit: Payer: PPO | Admitting: Dermatology

## 2021-07-31 DIAGNOSIS — L738 Other specified follicular disorders: Secondary | ICD-10-CM

## 2021-07-31 DIAGNOSIS — D485 Neoplasm of uncertain behavior of skin: Secondary | ICD-10-CM | POA: Diagnosis not present

## 2021-07-31 MED ORDER — TRETINOIN 0.05 % EX CREA: TOPICAL_CREAM | CUTANEOUS | 2 refills | Status: AC

## 2021-07-31 NOTE — Patient Instructions (Addendum)

## 2021-07-31 NOTE — Progress Notes (Signed)
? ?  Follow-Up Visit ?  ?Subjective  ?Vanessa Clark is a 71 y.o. 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 ? ? ? ?The following portions of the chart were reviewed this encounter and updated as appropriate:  ?  ?  ? ?Review of Systems:  No other skin or systemic complaints except as noted in HPI or Assessment and Plan. ? ?Objective  ?Well appearing patient in no apparent distress; mood and affect are within normal limits. ? ?A focused examination was performed including face, lower lip. Relevant physical exam findings are noted in the Assessment and Plan. ? ?labial mucosa of lower lip ?0.6 cm firm flesh nodule of inside mouth at mucosal lower lip ? ?nose ?Yellow papules ? ? ? ?Assessment & Plan  ?Neoplasm of uncertain behavior of skin ?labial mucosa of lower lip ? ?Epidermal / dermal shaving ? ?Lesion diameter (cm):  0.6 ?Informed consent: discussed and consent obtained   ?Patient was prepped and draped in usual sterile fashion: Area prepped with alcohol. ?Anesthesia: the lesion was anesthetized in a standard fashion   ?Anesthetic:  1% lidocaine w/ epinephrine 1-100,000 buffered w/ 8.4% NaHCO3 ?Instrument used: flexible razor blade   ?Hemostasis achieved with: pressure, aluminum chloride and electrodesiccation   ?Outcome: patient tolerated procedure well   ?Post-procedure details: wound care instructions given   ?Post-procedure details comment:  Ointment applied ? ?Specimen 1 - Surgical pathology ?Differential Diagnosis: Mucosal Fibroma vs other ?Check Margins: No ?0.6 cm firm flesh papule of inside mucosal lower lip ? ?Sebaceous hyperplasia ?nose ? ?Biopsy proven ? ?Start tretinoin 0.05% cream Apply to nose qhs as tolerated ? ?tretinoin (RETIN-A) 0.05 % cream - nose ?Apply a small amount to face every night as tolerated. ? ? ?Return in about 1 week (around 08/07/2021), or as scheduled, for f/up ISKs. ? ?I, Jamesetta Orleans, CMA, am acting as  scribe for Brendolyn Syrai, MD . ? ?Documentation: I have reviewed the above documentation for accuracy and completeness, and I agree with the above. ? ?Brendolyn Maretta MD  ? ?

## 2021-08-07 ENCOUNTER — Telehealth: Payer: Self-pay

## 2021-08-07 ENCOUNTER — Ambulatory Visit: Payer: PPO | Admitting: Dermatology

## 2021-08-07 DIAGNOSIS — L820 Inflamed seborrheic keratosis: Secondary | ICD-10-CM

## 2021-08-07 DIAGNOSIS — D492 Neoplasm of unspecified behavior of bone, soft tissue, and skin: Secondary | ICD-10-CM

## 2021-08-07 DIAGNOSIS — L814 Other melanin hyperpigmentation: Secondary | ICD-10-CM

## 2021-08-07 DIAGNOSIS — L720 Epidermal cyst: Secondary | ICD-10-CM

## 2021-08-07 DIAGNOSIS — L821 Other seborrheic keratosis: Secondary | ICD-10-CM

## 2021-08-07 NOTE — Telephone Encounter (Signed)
Patient advised biopsy of the labial mucosa of lower lip was benign. No further treatment needed.  ?

## 2021-08-07 NOTE — Patient Instructions (Addendum)
Cryotherapy Aftercare ? ?Wash gently with soap and water everyday.   ?Apply Vaseline and Band-Aid daily until healed.  ? ? ?WARTPEEL INSTRUCTIONS FOR TREATING SEBORRHEIC KERATOSES ? ?WartPEEL is a special compounded prescription medicine (5-fluorouracil/salicylic acid) that can be used to treat seborrheic keratoses ("Wisdom Spots"). It is not paid for by insurance. To obtain your medication, please call the pharmacy below, and they will ship it to your house.  ?- It should be applied to the raised seborrheic keratosis once a day at night and covered with a bandage. Do not apply it to completely flat spots (spots you cannot feel). ?- Care should be taken to avoid applying it to the normal skin in order to avoid irritation.  ?- It should not be used by pregnant women.  ?- It should not be used for more than 4 days at a time. ?- You should stop applying it immediately if you develop pain or more than mild irritation at the application area.  ? ?WartPEEL can cause scarring if it is applied to normal skin or if you continue applying it after the seborrheic keratosis has already been adequately treated. Do not use this on moles or any other spots your physician did not specifically recommend you treat.  ? ?WartPeel can be filled only at a special compounding pharmacy. Please call the pharmacy below to fill your prescription. Once they confirm your address and payment, they will mail you the medicine. DO NOT USE THIS MEDICINE IF YOU ARE PREGNANT OR THINKING OF BECOMING PREGNANT. ? ?[facility] ?Ph: [phone]  ? ?Must be dispensed in amber syringe to ensure quality of medication. ?PRIOR TO USING THIS MEDICATION, IT IS IMPORTANT TO INFORM YOUR PHYSICIAN IF YOU ARE PREGNANT OR THINKING OF BECOMING PREGNANT. ?IMPROPER USE OF THIS MEDICINE (USING IT  CAN CAUSE PERMANENT SCARRING.  ?**This medication was custom compounded for you based on the prescription orders of your physician. ? ?How to use this  medication: ?Apply medication at bedtime. ?Apply very small amount of medication to a flat plastic applicator. Use the applicator to apply a thin layer directly onto the raised seborrheic keratosis ("Wisdom Spot"). Use care to avoid applying the medicine to healthy skin. The medicine will break down healthy skin as well as the warts.  ?Cover the wart with the tape provided.  ?Put the cap back tightly on the syringe. ?Wash hands after applying the medicine. NEVER put the WartPEEL in the mouth, nose or eyes. ?Remove the tape in the morning and wash the area thoroughly.  ?In case of accidental ingestion or contact with the eye, nose or mouth, contact [place] or the local poison control. ?Do not use this medicine for more than 4 days to the affected area. You may only need to apply it for 1-2 nights if the spot is very thin.  If you develop any pain, stop using the medicine immediately and allow the area to heal, keeping it moist with vaseline while healing.  ?Once the area is completely healed, if there is still some seborrheic keratosis left, you can repeat treatment, being careful not to use the medicine for too long (longer than 4 days or after developing any pain). ? ?What to expect: ?During the first few days of application the seborrheic keratosis may swell and become white. This will slough off with continued applications. ?Normal Dosage: ?The medication is applied once daily for a time determined by your physician. ?Storage Requirements: ?Store this medication at room temperature. Keep out of  reach of children. PROTECT FROM LIGHT. ?Expiration Date: ?The medication is good for four months from the date made. Do not keep outdated medication. ?Side effects: ?Rash and irritation, if medication is applied to healthy/normal skin. Risk of scarring if the medication is applied for too long to healthy skin. ?Cautions and Warnings: ?Only apply the medication to the seborrheic keratoses. Do not apply to good skin.  Keep away from children. Do not use on nose, eyes or the mouth.   ? ? ? ?If You Need Anything After Your Visit ? ?If you have any questions or concerns for your doctor, please call our main line at [phone] and press option 4 to reach your doctor's medical assistant. If no one answers, please leave a voicemail as directed and we will return your call as soon as possible. Messages left after 4 pm will be answered the following business day.  ? ?You may also send [place] a message via MyChart. We typically respond to MyChart messages within 1-2 business days. ? ?For prescription refills, please ask your pharmacy to contact our office. Our fax number is [phone]. ? ?If you have an urgent issue when the clinic is closed that cannot wait until the next business day, you can page your doctor at the number below.   ? ?Please note that while we do our best to be available for urgent issues outside of office hours, we are not available 24/7.  ? ?If you have an urgent issue and are unable to reach [place], you may choose to seek medical care at your doctor's office, retail clinic, urgent care center, or emergency room. ? ?If you have a medical emergency, please immediately call 911 or go to the emergency department. ? ?Pager Numbers ? ?- Dr. Nehemiah Massed: [phone] ? ?- Dr. Laurence Ferrari: [phone] ? ?- Dr. Nicole Kindred: [phone] ? ?In the event of inclement weather, please call our main line at [phone] for an update on the status of any delays or closures. ? ?Dermatology Medication Tips: ?Please keep the boxes that topical medications come in in order to help keep track of the instructions about where and how to use these. Pharmacies typically print the medication instructions only on the boxes and not directly on the medication tubes.  ? ?If your medication is too expensive, please contact our office at [phone] option 4 or send [place] a message through [place].  ? ?We are unable to tell what your co-pay for medications will be  in advance as this is different depending on your insurance coverage. However, we may be able to find a substitute medication at lower cost or fill out paperwork to get insurance to cover a needed medication.  ? ?If a prior authorization is required to get your medication covered by your insurance company, please allow [place] 1-2 business days to complete this process. ? ?Drug prices often vary depending on where the prescription is filled and some pharmacies may offer cheaper prices. ? ?The website www.goodrx.com contains coupons for medications through different pharmacies. The prices here do not account for what the cost may be with help from insurance (it may be cheaper with your insurance), but the website can give you the price if you did not use any insurance.  ?- You can print the associated coupon and take it with your prescription to the pharmacy.  ?- You may also stop by our office during regular business hours and pick up a GoodRx coupon card.  ?- If you need your prescription sent electronically  to a different pharmacy, notify our office through [facility] or by phone at [phone] option 4. ? ? ? ? ?Si Usted Necesita Algo Despu?s de Su Visita ? ?Tambi?n puede enviarnos un mensaje a trav?s de MyChart. Por lo general respondemos a los mensajes de MyChart en el transcurso de 1 a 2 d?as h?biles. ? ?Para renovar recetas, por favor pida a su farmacia que se ponga en contacto con nuestra oficina. Nuestro n?mero de fax es el [phone]. ? ?Si tiene un asunto urgente cuando la cl?nica est? cerrada y que no puede esperar hasta el siguiente d?a h?bil, puede llamar/localizar a su doctor(a) al n?mero que aparece a continuaci?n.  ? ?Por favor, tenga en cuenta que aunque hacemos todo lo posible para estar disponibles para asuntos urgentes fuera del horario de oficina, no estamos disponibles las 24 horas del d?a, los 7 d?as de la semana.  ? ?Si tiene un problema urgente y no puede comunicarse con nosotros,  puede optar por buscar atenci?n m?dica  en el consultorio de su doctor(a), en una cl?nica privada, en un centro de atenci?n urgente o en una sala de emergencias. ? ?Si tiene una emergencia m?dica, por favor ll

## 2021-08-07 NOTE — Progress Notes (Signed)
? ?Follow-Up Visit ?  ?Subjective  ?Vanessa Clark is a 71 y.o. female who presents for the following: ISK f/u (Face, scalp, neck, R arm, 6wk f/u) and BENIGN SQUAMOUS HYPERPLASIA bx proven (labial mucosa of lower lip, bx proven).  She has multiple spots under arms that rub on bra. ? ? ?The following portions of the chart were reviewed this encounter and updated as appropriate:  ?  ?  ? ?Review of Systems:  No other skin or systemic complaints except as noted in HPI or Assessment and Plan. ? ?Objective  ?Well appearing patient in no apparent distress; mood and affect are within normal limits. ? ?A focused examination was performed including face, neck, scalp, arms. Relevant physical exam findings are noted in the Assessment and Plan. ? ?labial mucosa of lower lip ?Healing bx site ? ?L infraocular x 1, L temple x3, L sideburn x 1, R temple hairline x 3 (8) ?Stuck on waxy paps with erythema, some residual ? ?Left Axilla x 12, R axilla x 15 (27) ?Stuck on waxy paps with erythema, some pedunculated ? ?Waxy tan papules with erythema Inguinal crease ? ? ? ? ?Assessment & Plan  ? ?Milia ?- tiny firm white papules R upper eyelid margin ?- type of cyst ?- benign ?- may be extracted if symptomatic ?- observe  ? ?Seborrheic Keratoses ?- Stuck-on, waxy, tan-brown papules and/or plaques  ?- Benign-appearing ?- Discussed benign etiology and prognosis. ?- Observe ?- Call for any changes ? ?Lentigines ?- Scattered tan macules ?- Due to sun exposure ?- Benign-appering, observe ?- Recommend daily broad spectrum sunscreen SPF 30+ to sun-exposed areas, reapply every 2 hours as needed. ?- Call for any changes  ?  ?Neoplasm of skin ?labial mucosa of lower lip ? ?Bx proven Benign Squamous hyperplasia ?C/w repetitive biting ? ?Avoid biting this area of the lip or it may recur ? ?Inflamed seborrheic keratosis (8) ?L infraocular x 1, L temple x3, L sideburn x 1, R temple hairline x 3 ? ?Residual ISKs ? ?Destruction of lesion - L  infraocular x 1, L temple x3, L sideburn x 1, R temple hairline x 3 ? ?Destruction method: cryotherapy   ?Informed consent: discussed and consent obtained   ?Lesion destroyed using liquid nitrogen: Yes   ?Region frozen until ice ball extended beyond lesion: Yes   ?Outcome: patient tolerated procedure well with no complications   ?Post-procedure details: wound care instructions given   ?Additional details:  Prior to procedure, discussed risks of blister formation, small wound, skin dyspigmentation, or rare scar following cryotherapy. Recommend Vaseline ointment to treated areas while healing.  ? ?Related Medications ?triamcinolone cream (KENALOG) 0.1 % ?Apply to irritated itchy areas on the body, Avoid applying to face, groin, and axilla. Use as directed. Risk of skin atrophy with long-term use reviewed. ? ?Seborrheic keratosis, inflamed (27) ?Left Axilla x 12, R axilla x 15 ? ?Vs Skin Tags ? ?Start WartPeel qhs and cover to aa inguinal thighs, sent to [facility] ? ?Pt given instructions ? ?Destruction of lesion - Left Axilla x 12, R axilla x 15 ? ?Destruction method: cryotherapy   ?Informed consent: discussed and consent obtained   ?Lesion destroyed using liquid nitrogen: Yes   ?Region frozen until ice ball extended beyond lesion: Yes   ?Outcome: patient tolerated procedure well with no complications   ?Post-procedure details: wound care instructions given   ?Additional details:  Prior to procedure, discussed risks of blister formation, small wound, skin dyspigmentation, or rare scar following  cryotherapy. Recommend Vaseline ointment to treated areas while healing.  ? ? ?Return in about 2 months (around 10/07/2021) for ISK f/u. ? ?I, Vanessa Clark, RMA, am acting as scribe for [name], MD . ? ?Documentation: I have reviewed the above documentation for accuracy and completeness, and I agree with the above. ? ?[name]  ? ?

## 2021-08-07 NOTE — Telephone Encounter (Signed)
-----   Message from Brendolyn Sharan, MD sent at 08/07/2021 12:28 PM EDT ----- ?Skin , labial mucosa of lower lip ?BENIGN SQUAMOUS HYPERPLASIA ? ?Benign growth from chronic irritation (biting?) - please call patient ?

## 2021-10-09 ENCOUNTER — Ambulatory Visit: Payer: PPO | Admitting: Dermatology

## 2022-05-17 ENCOUNTER — Encounter: Payer: Self-pay | Admitting: Ophthalmology

## 2022-05-21 NOTE — Discharge Instructions (Signed)

## 2022-05-22 ENCOUNTER — Ambulatory Visit: Payer: PPO | Admitting: Anesthesiology

## 2022-05-22 ENCOUNTER — Encounter
Admission: RE | Disposition: A | Discharge status: Home / Self Care | Payer: Self-pay | Source: Home / Self Care | Attending: Ophthalmology

## 2022-05-22 ENCOUNTER — Ambulatory Visit
Admission: RE | Admit: 2022-05-22 | Discharge: 2022-05-22 | Disposition: A | Discharge status: Home / Self Care | Payer: PPO | Attending: Ophthalmology | Admitting: Ophthalmology

## 2022-05-22 ENCOUNTER — Encounter: Payer: Self-pay | Admitting: Ophthalmology

## 2022-05-22 ENCOUNTER — Other Ambulatory Visit: Payer: Self-pay

## 2022-05-22 DIAGNOSIS — F17210 Nicotine dependence, cigarettes, uncomplicated: Secondary | ICD-10-CM | POA: Insufficient documentation

## 2022-05-22 DIAGNOSIS — K219 Gastro-esophageal reflux disease without esophagitis: Secondary | ICD-10-CM | POA: Diagnosis not present

## 2022-05-22 DIAGNOSIS — H2512 Age-related nuclear cataract, left eye: Secondary | ICD-10-CM | POA: Insufficient documentation

## 2022-05-22 DIAGNOSIS — G4730 Sleep apnea, unspecified: Secondary | ICD-10-CM | POA: Insufficient documentation

## 2022-05-22 DIAGNOSIS — I10 Essential (primary) hypertension: Secondary | ICD-10-CM | POA: Insufficient documentation

## 2022-05-22 DIAGNOSIS — Z79899 Other long term (current) drug therapy: Secondary | ICD-10-CM | POA: Diagnosis not present

## 2022-05-22 HISTORY — DX: Sciatica, unspecified side: M54.30

## 2022-05-22 HISTORY — PX: CATARACT EXTRACTION W/PHACO: SHX586

## 2022-05-22 SURGERY — PHACOEMULSIFICATION, CATARACT, WITH IOL INSERTION
Anesthesia: General | Site: Eye | Laterality: Left

## 2022-05-22 MED ORDER — MOXIFLOXACIN HCL 0.5 % OP SOLN
OPHTHALMIC | Status: DC | PRN
  Administered 2022-05-22: .2 mL via OPHTHALMIC

## 2022-05-22 MED ORDER — MIDAZOLAM HCL 2 MG/2ML IJ SOLN
INTRAMUSCULAR | Status: DC | PRN
  Administered 2022-05-22 (×2): 1 mg via INTRAVENOUS

## 2022-05-22 MED ORDER — FENTANYL CITRATE (PF) 100 MCG/2ML IJ SOLN
INTRAMUSCULAR | Status: DC | PRN
  Administered 2022-05-22 (×2): 50 ug via INTRAVENOUS

## 2022-05-22 MED ORDER — LACTATED RINGERS IV SOLN: INTRAVENOUS | Status: DC

## 2022-05-22 MED ORDER — SIGHTPATH DOSE#1 NA CHONDROIT SULF-NA HYALURON 40-17 MG/ML IO SOLN
INTRAOCULAR | Status: DC | PRN
  Administered 2022-05-22: 1 mL via INTRAOCULAR

## 2022-05-22 MED ORDER — SIGHTPATH DOSE#1 BSS IO SOLN
INTRAOCULAR | Status: DC | PRN
  Administered 2022-05-22: 2 mL

## 2022-05-22 MED ORDER — SIGHTPATH DOSE#1 BSS IO SOLN
INTRAOCULAR | Status: DC | PRN
  Administered 2022-05-22: 15 mL via INTRAOCULAR

## 2022-05-22 MED ORDER — ARMC OPHTHALMIC DILATING DROPS
1.0000 | OPHTHALMIC | Status: DC | PRN
  Administered 2022-05-22 (×3): 1 via OPHTHALMIC

## 2022-05-22 MED ORDER — SIGHTPATH DOSE#1 BSS IO SOLN
INTRAOCULAR | Status: DC | PRN
  Administered 2022-05-22: 49 mL via OPHTHALMIC

## 2022-05-22 MED ORDER — BRIMONIDINE TARTRATE-TIMOLOL 0.2-0.5 % OP SOLN
OPHTHALMIC | Status: DC | PRN
  Administered 2022-05-22: 1 [drp] via OPHTHALMIC

## 2022-05-22 MED ORDER — TETRACAINE HCL 0.5 % OP SOLN
1.0000 [drp] | OPHTHALMIC | Status: DC | PRN
  Administered 2022-05-22 (×3): 1 [drp] via OPHTHALMIC

## 2022-05-22 SURGICAL SUPPLY — 12 items
CANNULA ANT/CHMB 27G (MISCELLANEOUS) IMPLANT
CANNULA ANT/CHMB 27GA (MISCELLANEOUS) IMPLANT
CATARACT SUITE SIGHTPATH (MISCELLANEOUS) ×1 IMPLANT
FEE CATARACT SUITE SIGHTPATH (MISCELLANEOUS) ×1 IMPLANT
GLOVE BIOGEL PI IND STRL 8 (GLOVE) ×1 IMPLANT
GLOVE SURG ENC TEXT LTX SZ8 (GLOVE) ×1 IMPLANT
LENS CLAREON VIVITY TORIC 230 ×1 IMPLANT
LENS IOL CLRN VT TRC 3 23.0 IMPLANT
NDL FILTER BLUNT 18X1 1/2 (NEEDLE) ×1 IMPLANT
NEEDLE FILTER BLUNT 18X1 1/2 (NEEDLE) ×1 IMPLANT
SYR 3ML LL SCALE MARK (SYRINGE) ×1 IMPLANT
WATER STERILE IRR 250ML POUR (IV SOLUTION) ×1 IMPLANT

## 2022-05-22 NOTE — H&P (Signed)
[facility]   Primary Care Physician:  Lynnell Jude, MD Ophthalmologist: Dr. George Ina  Pre-Procedure History & Physical: HPI:  Vanessa Clark is a 72 y.o. female here for cataract surgery.   Past Medical History:  Diagnosis Date   COVID-19 03/2020   Hypertension    Psoriatic arthritis ([place])    Sciatic pain    Bilateral.  Gets ablations by Dr Sharlet Salina   Sleep apnea    CPAP   Spinal stenosis    Vertigo     Past Surgical History:  Procedure Laterality Date   ABDOMINAL HYSTERECTOMY     COLONOSCOPY WITH PROPOFOL N/A 01/23/2021   Procedure: COLONOSCOPY WITH PROPOFOL;  Surgeon: Lucilla Lame, MD;  Location: [place];  Service: Endoscopy;  Laterality: N/A;  sleep apnea   HAND SURGERY     NASAL SINUS SURGERY     POLYPECTOMY N/A 01/23/2021   Procedure: POLYPECTOMY;  Surgeon: Lucilla Lame, MD;  Location: [place];  Service: Endoscopy;  Laterality: N/A;    Prior to Admission medications   Medication Sig Start Date End Date Taking? Authorizing Provider  Adalimumab (HUMIRA [place]) Inject into the skin.   Yes [provider]  amLODipine (NORVASC) 5 MG tablet Take 5 mg by mouth daily.   Yes [provider]  cetirizine (ZYRTEC) 10 MG tablet Take 10 mg by mouth daily.   Yes [provider]  chlorthalidone (HYGROTON) 25 MG tablet Take 25 mg by mouth daily.   Yes [provider]  DULoxetine (CYMBALTA) 60 MG capsule Take 60 mg by mouth daily.   Yes [provider]  HYDROcodone-acetaminophen (NORCO/VICODIN) 5-325 MG tablet Take 1 tablet by mouth every 6 (six) hours as needed for moderate pain.   Yes [provider]  Melatonin 10 MG TABS Take by mouth at bedtime.   Yes [provider]  meloxicam (MOBIC) 15 MG tablet Take 15 mg by mouth. 01/21/14  Yes [provider]  omeprazole (PRILOSEC) 20 MG capsule Take 20 mg by mouth. 12/22/13  Yes [provider]  Probiotic Product (PROBIOTIC DAILY  PO) Take by mouth daily.   Yes [provider]  valsartan (DIOVAN) 80 MG tablet Take 80 mg by mouth daily.   Yes [provider]  betamethasone dipropionate (DIPROLENE) 0.05 % cream APPLY TO AFFECTED AREA TWICE A DAY Patient not taking: Reported on 05/17/2022 10/15/17   [provider]  cannabidiol (EPIDIOLEX) 100 MG/ML solution Take by mouth 2 (two) times daily. Patient not taking: Reported on 05/17/2022    [provider]  gabapentin (NEURONTIN) 300 MG capsule Take 300 mg by mouth 3 (three) times daily. Patient not taking: Reported on 05/17/2022    [provider]  hydrocortisone 2.5 % cream Apply to rash under the breast twice daily up to two weeks. Patient not taking: Reported on 05/17/2022 01/26/21   Laurence Ferrari, [place], MD  ketoconazole (NIZORAL) 2 % cream Apply to the rash under the breast twice daily as needed. Patient not taking: Reported on 05/17/2022 01/26/21   Laurence Ferrari, [place], MD  loratadine (CLARITIN) 10 MG tablet Frequency:PRN   Dosage:0.0     Instructions:  Note:Dose: UNKNOWN Patient not taking: Reported on 05/17/2022 03/30/11   [provider]  mometasone (ELOCON) 0.1 % ointment Apply to fissures in fingers at night. Patient not taking: Reported on 05/17/2022 02/11/20   Ralene Bathe, MD  NEOMYCIN-POLYMYXIN-HYDROCORTISONE (CORTISPORIN) 1 % SOLN OTIC solution Apply 1-2 drops to toe BID after soaking Patient not taking: Reported  on 01/05/2021 12/18/17   Hyatt, Max T, DPM  tretinoin (RETIN-A) 0.05 % cream Apply a small amount to face every night as tolerated. Patient not taking: Reported on 05/17/2022 07/31/21   Brendolyn Shekera, MD  triamcinolone cream (KENALOG) 0.1 % Apply to irritated itchy areas on the body, Avoid applying to face, groin, and axilla. Use as directed. Risk of skin atrophy with long-term use reviewed. Patient not taking: Reported on 05/17/2022 11/29/20   Alfonso Patten, MD    Allergies as of 04/26/2022 - Review Complete  08/07/2021  Allergen Reaction Noted   Sulfacetamide sodium Other (See Comments) 08/17/2014   Sulfa antibiotics Other (See Comments) 04/08/2014    History reviewed. No pertinent family history.  Social History   Socioeconomic History   Marital status: Married    Spouse name: Not on file   Number of children: Not on file   Years of education: Not on file   Highest education level: Not on file  Occupational History   Not on file  Tobacco Use   Smoking status: Some Days    Packs/day: 0.10    Years: 40.00    Total pack years: 4.00    Types: Cigarettes   Smokeless tobacco: Never   Tobacco comments:    Starting smoking in her 10s.  Cut back to 1-2 cigs some days since COVID 12/21  Vaping Use   Vaping Use: Never used  Substance and Sexual Activity   Alcohol use: No    Alcohol/week: 0.0 standard drinks of alcohol   Drug use: Not on file   Sexual activity: Not on file  Other Topics Concern   Not on file  Social History Narrative   Not on file   Social Determinants of Health   Financial Resource Strain: Not on file  Food Insecurity: Not on file  Transportation Needs: Not on file  Physical Activity: Not on file  Stress: Not on file  Social Connections: Not on file  Intimate Partner Violence: Not on file    Review of Systems: See HPI, otherwise negative ROS  Physical Exam: BP (!) 115/50   Pulse 67   Temp (!) 97.5 F (36.4 C) (Temporal)   Resp 18   Ht 5' 3"$  (1.6 m)   Wt 91.6 kg   SpO2 96%   BMI 35.78 kg/m  General:   Alert, cooperative in NAD Head:  Normocephalic and atraumatic. Respiratory:  Normal work of breathing. Cardiovascular:  RRR  Impression/Plan: Vanessa Clark is here for cataract surgery.  Risks, benefits, limitations, and alternatives regarding cataract surgery have been reviewed with the patient.  Questions have been answered.  All parties agreeable.   Birder Robson, MD  05/22/2022, 8:36 AM

## 2022-05-22 NOTE — Transfer of Care (Signed)
Immediate Anesthesia Transfer of Care Note  Patient: Vanessa Clark  Procedure(s) Performed: CATARACT EXTRACTION PHACO AND INTRAOCULAR LENS PLACEMENT (IOC) LEFT CLAREON VIVITY TORIC 5.01 00:38.5 (Left: Eye)  Patient Location: PACU  Anesthesia Type: General  Level of Consciousness: awake, alert  and patient cooperative  Airway and Oxygen Therapy: Patient Spontanous Breathing and Patient connected to supplemental oxygen  Post-op Assessment: Post-op Vital signs reviewed, Patient's Cardiovascular Status Stable, Respiratory Function Stable, Patent Airway and No signs of Nausea or vomiting  Post-op Vital Signs: Reviewed and stable  Complications: No notable events documented.

## 2022-05-22 NOTE — Anesthesia Postprocedure Evaluation (Signed)
Anesthesia Post Note  Patient: Nafeesa Mijangos  Procedure(s) Performed: CATARACT EXTRACTION PHACO AND INTRAOCULAR LENS PLACEMENT (IOC) LEFT CLAREON VIVITY TORIC 5.01 00:38.5 (Left: Eye)  Patient location during evaluation: PACU Anesthesia Type: General Level of consciousness: awake and alert Pain management: pain level controlled Vital Signs Assessment: post-procedure vital signs reviewed and stable Respiratory status: spontaneous breathing, nonlabored ventilation, respiratory function stable and patient connected to nasal cannula oxygen Cardiovascular status: stable and blood pressure returned to baseline Postop Assessment: no apparent nausea or vomiting Anesthetic complications: no   No notable events documented.   Last Vitals:  Vitals:   05/22/22 0901 05/22/22 0906  BP: (!) 96/44 (!) 102/48  Pulse: 64 62  Resp: 14 14  Temp: 36.4 C 36.4 C  SpO2: 96% 94%    Last Pain:  Vitals:   05/22/22 0906  TempSrc:   PainSc: 0-No pain                 Martha Clan

## 2022-05-22 NOTE — Op Note (Signed)
PREOPERATIVE DIAGNOSIS:  Nuclear sclerotic cataract of the left eye.   POSTOPERATIVE DIAGNOSIS:  Nuclear sclerotic cataract of the left eye.   OPERATIVE PROCEDURE: Procedure(s): CATARACT EXTRACTION PHACO AND INTRAOCULAR LENS PLACEMENT (IOC) LEFT CLAREON VIVITY TORIC 5.01 00:38.5   SURGEON:  Birder Robson, MD.   ANESTHESIA: 1.      Managed anesthesia care. 2.     0.59m os Shugarcaine was instilled following the paracentesis [email or web address]   COMPLICATIONS:  None.   TECHNIQUE:   Stop and chop    DESCRIPTION OF PROCEDURE:  The patient was examined and consented in the preoperative holding area where the aforementioned topical anesthesia was applied to the left eye.  The patient was brought back to the Operating Room where he was sat upright on the gurney and given a target to fixate upon while the eye was marked at the 3:00 and 9:00 position.  The patient was then reclined on the operating table.  The eye was prepped and draped in the usual sterile ophthalmic fashion and a lid speculum was placed. A paracentesis was created with the side port blade and the anterior chamber was filled with viscoelastic. A near clear corneal incision was performed with the steel keratome. A continuous curvilinear capsulorrhexis was performed with a cystotome followed by the capsulorrhexis forceps. Hydrodissection and hydrodelineation were carried out with BSS on a blunt cannula. The lens was removed in a stop and chop technique and the remaining cortical material was removed with the irrigation-aspiration handpiece. The eye was inflated with viscoelastic and the ZCT lens was placed in the eye and rotated to within a few degrees of the predetermined orientation.  The remaining viscoelastic was removed from the eye.  The Sinskey hook was used to rotate the toric lens into its final resting place at 136 degrees.  0.1 ml of Vigamox was placed in the anterior chamber. The eye was inflated to a physiologic pressure and found  to be watertight.  The eye was dressed with Vigamox and Combigan . The patient was given protective glasses to wear throughout the day and a shield with which to sleep tonight. The patient was also given drops with which to begin a drop regimen today and will follow-up with me in one day. Implant Name Type Inv. Item Serial No. Manufacturer Lot No. LRB No. Used Action  LENS CLAREON VIVITY TORIC 230 - [id number]  Left 1 Implanted   Procedure(s) with comments: CATARACT EXTRACTION PHACO AND INTRAOCULAR LENS PLACEMENT (IOC) LEFT CLAREON VIVITY TORIC 5.01 00:38.5 (Left) - sleep apnea  Electronically signed: WBirder Robson2/20/20249:00 AM

## 2022-05-22 NOTE — Anesthesia Preprocedure Evaluation (Signed)
Anesthesia Evaluation  Patient identified by MRN, date of birth, ID band Patient awake    Reviewed: Allergy & Precautions, H&P , NPO status , Patient's Chart, lab work & pertinent test results, reviewed documented beta blocker date and time   History of Anesthesia Complications Negative for: history of anesthetic complications  Airway Mallampati: III  TM Distance: >3 FB Neck ROM: full    Dental  (+) Dental Advidsory Given, Implants, Caps   Pulmonary neg shortness of breath, sleep apnea and Continuous Positive Airway Pressure Ventilation , neg COPD, neg recent URI, Current Smoker and Patient abstained from smoking.   Pulmonary exam normal breath sounds clear to auscultation       Cardiovascular Exercise Tolerance: Good hypertension, (-) angina (-) Past MI and (-) Cardiac Stents Normal cardiovascular exam(-) dysrhythmias (-) Valvular Problems/Murmurs Rhythm:regular Rate:Normal     Neuro/Psych negative neurological ROS  negative psych ROS   GI/Hepatic Neg liver ROS,GERD  Medicated and Controlled,,  Endo/Other  negative endocrine ROS    Renal/GU negative Renal ROS  negative genitourinary   Musculoskeletal   Abdominal   Peds  Hematology negative hematology ROS (+)   Anesthesia Other Findings Past Medical History: 03/2020: COVID-19 No date: Hypertension No date: Psoriatic arthritis (HCC) No date: Sciatic pain     Comment:  Bilateral.  Gets ablations by Dr Sharlet Salina No date: Sleep apnea     Comment:  CPAP No date: Spinal stenosis No date: Vertigo   Reproductive/Obstetrics negative OB ROS                             Anesthesia Physical Anesthesia Plan  ASA: 2  Anesthesia Plan: General   Post-op Pain Management:    Induction: Intravenous  PONV Risk Score and Plan: 2 and Midazolam and Treatment may vary due to age or medical condition  Airway Management Planned: Natural Airway and  Nasal Cannula  Additional Equipment:   Intra-op Plan:   Post-operative Plan:   Informed Consent: I have reviewed the patients History and Physical, chart, labs and discussed the procedure including the risks, benefits and alternatives for the proposed anesthesia with the patient or authorized representative who has indicated his/her understanding and acceptance.     Dental Advisory Given  Plan Discussed with: Anesthesiologist, CRNA and Surgeon  Anesthesia Plan Comments:        Anesthesia Quick Evaluation

## 2022-05-23 ENCOUNTER — Encounter: Payer: Self-pay | Admitting: Ophthalmology

## 2022-05-23 ENCOUNTER — Other Ambulatory Visit: Payer: Self-pay

## 2022-05-31 NOTE — Discharge Instructions (Signed)

## 2022-06-05 ENCOUNTER — Ambulatory Visit: Payer: PPO | Admitting: Anesthesiology

## 2022-06-05 ENCOUNTER — Ambulatory Visit
Admission: RE | Admit: 2022-06-05 | Discharge: 2022-06-05 | Disposition: A | Discharge status: Home / Self Care | Payer: PPO | Attending: Ophthalmology | Admitting: Ophthalmology

## 2022-06-05 ENCOUNTER — Encounter
Admission: RE | Disposition: A | Discharge status: Home / Self Care | Payer: Self-pay | Source: Home / Self Care | Attending: Ophthalmology

## 2022-06-05 ENCOUNTER — Encounter: Payer: Self-pay | Admitting: Ophthalmology

## 2022-06-05 ENCOUNTER — Other Ambulatory Visit: Payer: Self-pay

## 2022-06-05 DIAGNOSIS — I10 Essential (primary) hypertension: Secondary | ICD-10-CM | POA: Diagnosis not present

## 2022-06-05 DIAGNOSIS — H2511 Age-related nuclear cataract, right eye: Secondary | ICD-10-CM | POA: Diagnosis present

## 2022-06-05 DIAGNOSIS — L4050 Arthropathic psoriasis, unspecified: Secondary | ICD-10-CM | POA: Insufficient documentation

## 2022-06-05 DIAGNOSIS — G4730 Sleep apnea, unspecified: Secondary | ICD-10-CM | POA: Insufficient documentation

## 2022-06-05 DIAGNOSIS — F17210 Nicotine dependence, cigarettes, uncomplicated: Secondary | ICD-10-CM | POA: Diagnosis not present

## 2022-06-05 DIAGNOSIS — Z79899 Other long term (current) drug therapy: Secondary | ICD-10-CM | POA: Insufficient documentation

## 2022-06-05 HISTORY — PX: CATARACT EXTRACTION W/PHACO: SHX586

## 2022-06-05 HISTORY — DX: Zoster without complications: B02.9

## 2022-06-05 SURGERY — PHACOEMULSIFICATION, CATARACT, WITH IOL INSERTION
Anesthesia: Topical | Site: Eye | Laterality: Right

## 2022-06-05 MED ORDER — FENTANYL CITRATE PF 50 MCG/ML IJ SOSY: 25.0000 ug | PREFILLED_SYRINGE | INTRAMUSCULAR | Status: DC | PRN

## 2022-06-05 MED ORDER — SIGHTPATH DOSE#1 NA CHONDROIT SULF-NA HYALURON 40-17 MG/ML IO SOLN
INTRAOCULAR | Status: DC | PRN
  Administered 2022-06-05: 1 mL via INTRAOCULAR

## 2022-06-05 MED ORDER — ARMC OPHTHALMIC DILATING DROPS
1.0000 | OPHTHALMIC | Status: DC | PRN
  Administered 2022-06-05 (×3): 1 via OPHTHALMIC

## 2022-06-05 MED ORDER — SIGHTPATH DOSE#1 BSS IO SOLN
INTRAOCULAR | Status: DC | PRN
  Administered 2022-06-05: 2 mL

## 2022-06-05 MED ORDER — ONDANSETRON HCL 4 MG/2ML IJ SOLN: 4.0000 mg | Freq: Once | INTRAMUSCULAR | Status: DC | PRN

## 2022-06-05 MED ORDER — MIDAZOLAM HCL 2 MG/2ML IJ SOLN
INTRAMUSCULAR | Status: DC | PRN
  Administered 2022-06-05: 2 mg via INTRAVENOUS

## 2022-06-05 MED ORDER — LACTATED RINGERS IV SOLN: INTRAVENOUS | Status: DC

## 2022-06-05 MED ORDER — SIGHTPATH DOSE#1 BSS IO SOLN
INTRAOCULAR | Status: DC | PRN
  Administered 2022-06-05: 64 mL via OPHTHALMIC

## 2022-06-05 MED ORDER — TETRACAINE HCL 0.5 % OP SOLN
1.0000 [drp] | OPHTHALMIC | Status: DC | PRN
  Administered 2022-06-05 (×3): 1 [drp] via OPHTHALMIC

## 2022-06-05 MED ORDER — BRIMONIDINE TARTRATE-TIMOLOL 0.2-0.5 % OP SOLN
OPHTHALMIC | Status: DC | PRN
  Administered 2022-06-05: 1 [drp] via OPHTHALMIC

## 2022-06-05 MED ORDER — SIGHTPATH DOSE#1 BSS IO SOLN
INTRAOCULAR | Status: DC | PRN
  Administered 2022-06-05: 15 mL via INTRAOCULAR

## 2022-06-05 MED ORDER — FENTANYL CITRATE (PF) 100 MCG/2ML IJ SOLN
INTRAMUSCULAR | Status: DC | PRN
  Administered 2022-06-05 (×2): 50 ug via INTRAVENOUS

## 2022-06-05 MED ORDER — MOXIFLOXACIN HCL 0.5 % OP SOLN
OPHTHALMIC | Status: DC | PRN
  Administered 2022-06-05: .2 mL via OPHTHALMIC

## 2022-06-05 SURGICAL SUPPLY — 16 items
CANNULA ANT/CHMB 27G (MISCELLANEOUS) IMPLANT
CANNULA ANT/CHMB 27GA (MISCELLANEOUS) IMPLANT
CATARACT SUITE SIGHTPATH (MISCELLANEOUS) ×1 IMPLANT
FEE CATARACT SUITE SIGHTPATH (MISCELLANEOUS) ×1 IMPLANT
GLOVE BIOGEL PI IND STRL 8 (GLOVE) ×1 IMPLANT
GLOVE SURG ENC TEXT LTX SZ8 (GLOVE) ×1 IMPLANT
LENS CLAREON VIVITY TORIC 230 ×1 IMPLANT
LENS IOL CLRN VT TRC 3 23.0 IMPLANT
NDL FILTER BLUNT 18X1 1/2 (NEEDLE) ×1 IMPLANT
NEEDLE FILTER BLUNT 18X1 1/2 (NEEDLE) ×1 IMPLANT
PACK VIT ANT 23G (MISCELLANEOUS) IMPLANT
RING MALYGIN (MISCELLANEOUS) IMPLANT
SUT ETHILON 10-0 CS-B-6CS-B-6 (SUTURE)
SUTURE EHLN 10-0 CS-B-6CS-B-6 (SUTURE) IMPLANT
SYR 3ML LL SCALE MARK (SYRINGE) ×1 IMPLANT
WATER STERILE IRR 250ML POUR (IV SOLUTION) ×1 IMPLANT

## 2022-06-05 NOTE — Transfer of Care (Signed)
Immediate Anesthesia Transfer of Care Note  Patient: Vanessa Clark  Procedure(s) Performed: CATARACT EXTRACTION PHACO AND INTRAOCULAR LENS PLACEMENT (IOC) RIGHT CLAREON VIVITY TORIC 6.63 00:44.1 (Right: Eye)  Patient Location: PACU  Anesthesia Type: No value filed.  Level of Consciousness: awake, alert  and patient cooperative  Airway and Oxygen Therapy: Patient Spontanous Breathing and Patient connected to supplemental oxygen  Post-op Assessment: Post-op Vital signs reviewed, Patient's Cardiovascular Status Stable, Respiratory Function Stable, Patent Airway and No signs of Nausea or vomiting  Post-op Vital Signs: Reviewed and stable  Complications: No notable events documented.

## 2022-06-05 NOTE — H&P (Signed)
[facility]   Primary Care Physician:  Lynnell Jude, MD Ophthalmologist: Dr. George Ina  Pre-Procedure History & Physical: HPI:  Vanessa Clark is a 72 y.o. female here for cataract surgery.   Past Medical History:  Diagnosis Date   COVID-19 03/2020   Hypertension    Psoriatic arthritis ([place])    Sciatic pain    Bilateral.  Gets ablations by Dr Sharlet Salina   Shingles    Sleep apnea    CPAP   Spinal stenosis    Vertigo     Past Surgical History:  Procedure Laterality Date   ABDOMINAL HYSTERECTOMY     CATARACT EXTRACTION W/PHACO Left 05/22/2022   Procedure: CATARACT EXTRACTION PHACO AND INTRAOCULAR LENS PLACEMENT ([place]) LEFT CLAREON VIVITY TORIC 5.01 00:38.5;  Surgeon: Birder Robson, MD;  Location: [place];  Service: Ophthalmology;  Laterality: Left;  sleep apnea   COLONOSCOPY WITH PROPOFOL N/A 01/23/2021   Procedure: COLONOSCOPY WITH PROPOFOL;  Surgeon: Lucilla Lame, MD;  Location: [place];  Service: Endoscopy;  Laterality: N/A;  sleep apnea   HAND SURGERY     NASAL SINUS SURGERY     POLYPECTOMY N/A 01/23/2021   Procedure: POLYPECTOMY;  Surgeon: Lucilla Lame, MD;  Location: [place];  Service: Endoscopy;  Laterality: N/A;    Prior to Admission medications   Medication Sig Start Date End Date Taking? Authorizing Provider  Adalimumab (HUMIRA Hodges) Inject into the skin.   Yes [provider]  amLODipine (NORVASC) 5 MG tablet Take 5 mg by mouth daily.   Yes [provider]  betamethasone dipropionate (DIPROLENE) 0.05 % cream  10/15/17  Yes [provider]  cannabidiol (EPIDIOLEX) 100 MG/ML solution Take by mouth 2 (two) times daily.   Yes [provider]  cetirizine (ZYRTEC) 10 MG tablet Take 10 mg by mouth daily.   Yes [provider]  chlorthalidone (HYGROTON) 25 MG tablet Take 25 mg by mouth daily.   Yes [provider]  DULoxetine (CYMBALTA) 60 MG capsule Take 60 mg by mouth daily.    Yes [provider]  gabapentin (NEURONTIN) 300 MG capsule Take 300 mg by mouth 3 (three) times daily.   Yes [provider]  HYDROcodone-acetaminophen (NORCO/VICODIN) 5-325 MG tablet Take 1 tablet by mouth every 6 (six) hours as needed for moderate pain.   Yes [provider]  hydrocortisone 2.5 % cream Apply to rash under the breast twice daily up to two weeks. 01/26/21  Yes Moye, [place], MD  ketoconazole (NIZORAL) 2 % cream Apply to the rash under the breast twice daily as needed. 01/26/21  Yes Moye, [place], MD  loratadine (CLARITIN) 10 MG tablet Frequency:PRN   Dosage:0.0     Instructions:  Note:Dose: UNKNOWN 03/30/11  Yes [provider]  Melatonin 10 MG TABS Take by mouth at bedtime.   Yes [provider]  meloxicam (MOBIC) 15 MG tablet Take 15 mg by mouth. 01/21/14  Yes [provider]  mometasone (ELOCON) 0.1 % ointment Apply to fissures in fingers at night. 02/11/20  Yes Ralene Bathe, MD  NEOMYCIN-POLYMYXIN-HYDROCORTISONE (CORTISPORIN) 1 % SOLN OTIC solution Apply 1-2 drops to toe BID after soaking 12/18/17  Yes Hyatt, Max T, DPM  omeprazole (PRILOSEC) 20 MG capsule Take 20 mg by mouth. 12/22/13  Yes [provider]  Probiotic Product (PROBIOTIC DAILY PO) Take by mouth daily.   Yes [provider]  tretinoin (RETIN-A) 0.05 % cream Apply a small amount to face every night as tolerated.  07/31/21  Yes Brendolyn Maycee, MD  triamcinolone cream (KENALOG) 0.1 % Apply to irritated itchy areas on the body, Avoid applying to face, groin, and axilla. Use as directed. Risk of skin atrophy with long-term use reviewed. 11/29/20  Yes Moye, [place], MD  valACYclovir (VALTREX) 1000 MG tablet Take 1,000 mg by mouth 3 (three) times daily.   Yes [provider]  valsartan (DIOVAN) 80 MG tablet Take 80 mg by mouth daily.   Yes [provider]    Allergies as of 04/26/2022 - Review Complete 08/07/2021  Allergen  Reaction Noted   Sulfacetamide sodium Other (See Comments) 08/17/2014   Sulfa antibiotics Other (See Comments) 04/08/2014    History reviewed. No pertinent family history.  Social History   Socioeconomic History   Marital status: Married    Spouse name: Not on file   Number of children: Not on file   Years of education: Not on file   Highest education level: Not on file  Occupational History   Not on file  Tobacco Use   Smoking status: Some Days    Packs/day: 0.10    Years: 40.00    Total pack years: 4.00    Types: Cigarettes   Smokeless tobacco: Never   Tobacco comments:    Starting smoking in her 34s.  Cut back to 1-2 cigs some days since COVID 12/21  Vaping Use   Vaping Use: Never used  Substance and Sexual Activity   Alcohol use: No    Alcohol/week: 0.0 standard drinks of alcohol   Drug use: Not on file   Sexual activity: Not on file  Other Topics Concern   Not on file  Social History Narrative   Not on file   Social Determinants of Health   Financial Resource Strain: Not on file  Food Insecurity: Not on file  Transportation Needs: Not on file  Physical Activity: Not on file  Stress: Not on file  Social Connections: Not on file  Intimate Partner Violence: Not on file    Review of Systems: See HPI, otherwise negative ROS  Physical Exam: BP 138/75   Pulse 72   Temp 97.8 F (36.6 C) (Temporal)   Resp 14   Wt 95.3 kg   SpO2 96%   BMI 37.20 kg/m  General:   Alert, cooperative in NAD Head:  Normocephalic and atraumatic. Respiratory:  Normal work of breathing. Cardiovascular:  RRR  Impression/Plan: Vanessa Clark is here for cataract surgery.  Risks, benefits, limitations, and alternatives regarding cataract surgery have been reviewed with the patient.  Questions have been answered.  All parties agreeable.   Birder Robson, MD  06/05/2022, 9:38 AM

## 2022-06-05 NOTE — Op Note (Signed)
PREOPERATIVE DIAGNOSIS:  Nuclear sclerotic cataract of the right eye.   POSTOPERATIVE DIAGNOSIS:  Nuclear sclerotic cataract of the right eye.   OPERATIVE PROCEDURE: Procedure(s): CATARACT EXTRACTION PHACO AND INTRAOCULAR LENS PLACEMENT (IOC) RIGHT CLAREON VIVITY TORIC 6.63 00:44.1   SURGEON:  Birder Robson, MD.   ANESTHESIA: 1.      Managed anesthesia care. 2.     0.56m of Shugarcaine was instilled following the paracentesis  Anesthesiologist: AMolli Barrows MD CRNA: JMoises Blood CRNA  COMPLICATIONS:  None.   TECHNIQUE:   Stop and chop    DESCRIPTION OF PROCEDURE:  The patient was examined and consented in the preoperative holding area where the aforementioned topical anesthesia was applied to the right eye.  The patient was brought back to the Operating Room where he was sat upright on the gurney and given a target to fixate upon while the eye was marked at the 3:00 and 9:00 position.  The patient was then reclined on the operating table.  The eye was prepped and draped in the usual sterile ophthalmic fashion and a lid speculum was placed. A paracentesis was created with the side port blade and the anterior chamber was filled with viscoelastic. A near clear corneal incision was performed with the steel keratome. A continuous curvilinear capsulorrhexis was performed with a cystotome followed by the capsulorrhexis forceps. Hydrodissection and hydrodelineation were carried out with BSS on a blunt cannula. The lens was removed in a stop and chop technique and the remaining cortical material was removed with the irrigation-aspiration handpiece. The eye was inflated with viscoelastic and the ZCT  lens  was placed in the eye and rotated to within a few degrees of the predetermined orientation.  The remaining viscoelastic was removed from the eye.  The Sinskey hook was used to rotate the toric lens into its final resting place at 180 degrees.  0. The eye was inflated to a physiologic pressure and  found to be watertight. 0.149mof Vigamox was placed in the anterior chamber.  The eye was dressed with Vigamox. And Combigan. The patient was given protective glasses to wear throughout the day and a shield with which to sleep tonight. The patient was also given drops with which to begin a drop regimen today and will follow-up with me in one day. Implant Name Type Inv. Item Serial No. Manufacturer Lot No. LRB No. Used Action  LENS CLAREON VIVITY TORIC 230 - [id number]  Right 1 Implanted   Procedure(s) with comments: CATARACT EXTRACTION PHACO AND INTRAOCULAR LENS PLACEMENT (IOC) RIGHT CLAREON VIVITY TORIC 6.63 00:44.1 (Right) - sleep apnea  Electronically signed: WiBirder Robson/08/2022 10:04 AM

## 2022-06-05 NOTE — Anesthesia Postprocedure Evaluation (Signed)
Anesthesia Post Note  Patient: Furniture conservator/restorer  Procedure(s) Performed: CATARACT EXTRACTION PHACO AND INTRAOCULAR LENS PLACEMENT (IOC) RIGHT CLAREON VIVITY TORIC 6.63 00:44.1 (Right: Eye)  Patient location during evaluation: PACU Anesthesia Type: MAC Level of consciousness: awake and alert Pain management: pain level controlled Vital Signs Assessment: post-procedure vital signs reviewed and stable Respiratory status: spontaneous breathing, nonlabored ventilation, respiratory function stable and patient connected to nasal cannula oxygen Cardiovascular status: stable and blood pressure returned to baseline Postop Assessment: no apparent nausea or vomiting Anesthetic complications: no   No notable events documented.   Last Vitals:  Vitals:   06/05/22 1005 06/05/22 1010  BP: 117/60 101/86  Pulse: 71 77  Resp: (!) 9 14  Temp: (!) 36 C (!) 36.3 C  SpO2: 96% 91%    Last Pain:  Vitals:   06/05/22 1010  TempSrc:   PainSc: 0-No pain                 Molli Barrows

## 2022-06-05 NOTE — Anesthesia Preprocedure Evaluation (Signed)
Anesthesia Evaluation  Patient identified by MRN, date of birth, ID band Patient awake    Reviewed: Allergy & Precautions, H&P , NPO status , Patient's Chart, lab work & pertinent test results, reviewed documented beta blocker date and time   Airway Mallampati: II  TM Distance: >3 FB Neck ROM: full    Dental no notable dental hx. (+) Teeth Intact   Pulmonary neg pulmonary ROS, Current Smoker and Patient abstained from smoking.   Pulmonary exam normal breath sounds clear to auscultation       Cardiovascular Exercise Tolerance: Good hypertension, negative cardio ROS  Rhythm:regular Rate:Normal     Neuro/Psych negative neurological ROS  negative psych ROS   GI/Hepatic negative GI ROS, Neg liver ROS,,,  Endo/Other  negative endocrine ROSdiabetes    Renal/GU      Musculoskeletal   Abdominal   Peds  Hematology negative hematology ROS (+)   Anesthesia Other Findings   Reproductive/Obstetrics negative OB ROS                             Anesthesia Physical Anesthesia Plan  ASA: 3  Anesthesia Plan: MAC   Post-op Pain Management:    Induction:   PONV Risk Score and Plan:   Airway Management Planned:   Additional Equipment:   Intra-op Plan:   Post-operative Plan:   Informed Consent: I have reviewed the patients History and Physical, chart, labs and discussed the procedure including the risks, benefits and alternatives for the proposed anesthesia with the patient or authorized representative who has indicated his/her understanding and acceptance.       Plan Discussed with: CRNA  Anesthesia Plan Comments:        Anesthesia Quick Evaluation

## 2022-06-06 ENCOUNTER — Encounter: Payer: Self-pay | Admitting: Ophthalmology

## 2022-12-25 IMAGING — MR MR LUMBAR SPINE W/O CM
5 series · 30 of 48 positions shown · non-contrast
Comparison: None.

CLINICAL DATA: Spinal stenosis of lumbosacral region
(LFZ-D0-CM)

Low back pain radiating down bilateral legs.  Painful ambulation.
EXAM:
MRI LUMBAR SPINE WITHOUT CONTRAST
TECHNIQUE: Multiplanar, multisequence MR imaging of the lumbar spine was
performed. No intravenous contrast was administered.

[Series 5: T2 · sagittal · 4.0mm · 0.81mm/px · 6 of 17 slices shown (1 of 2)]
[im 1/17]
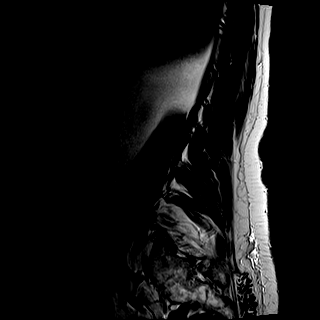
[im 4/17]
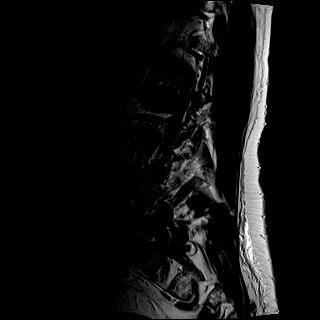
[im 7/17]
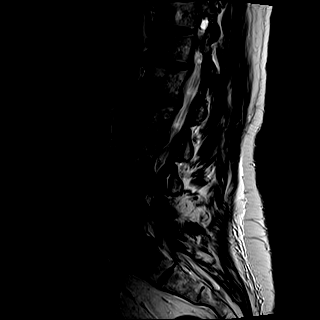
[im 10/17]
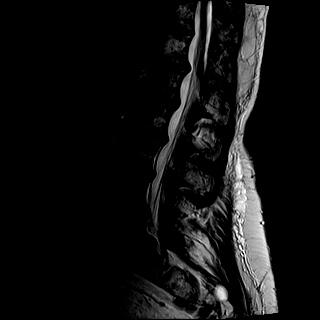
[im 13/17]
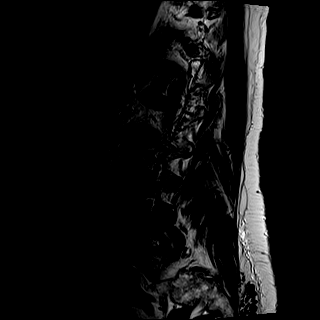
[im 17/17]
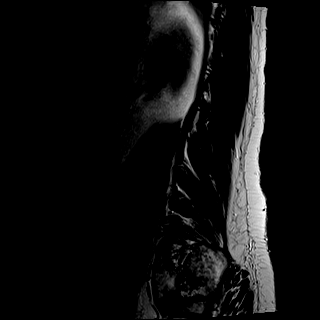

[Series 6: T1 · sagittal · 4.0mm · 0.81mm/px · 7 of 17 slices shown (1 of 2)]
[im 1/17]
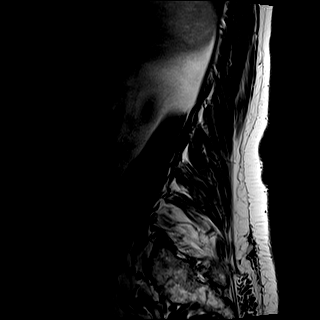
[im 3/17]
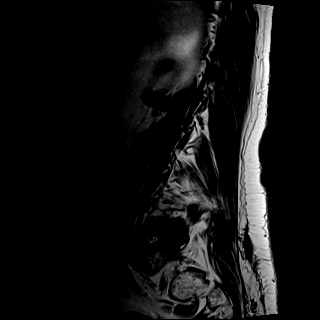
[im 6/17]
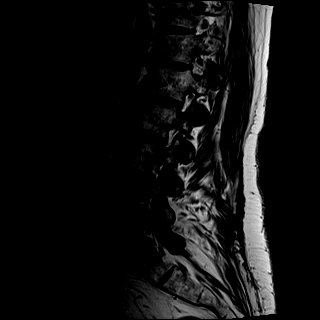
[im 9/17]
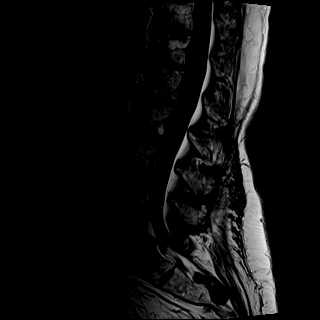
[im 11/17]
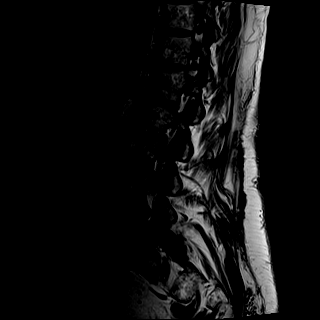
[im 14/17]
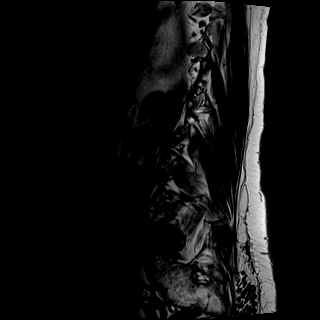
[im 17/17]
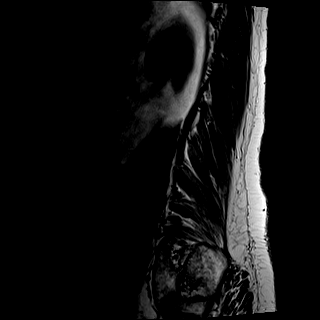

[Series 7: STIR · sagittal · 4.0mm · 0.41mm/px · 1 of 17 slices shown]
[im 1/17]
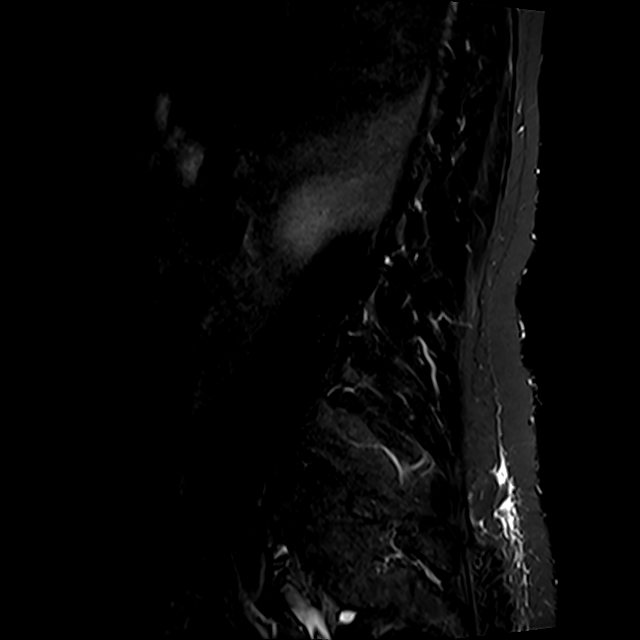

[Series 8: T2 · axial · 4.0mm · 0.78mm/px · z∈[-126,+71]mm · 8 of 34 slices shown (2 of 2)]
[im 1/34]
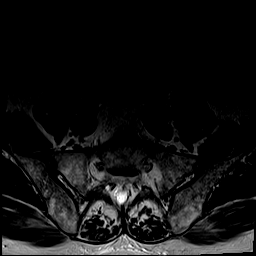
[im 6/34]
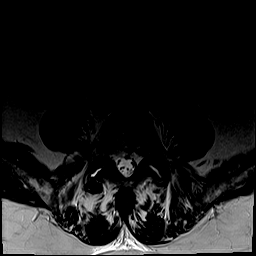
[im 11/34]
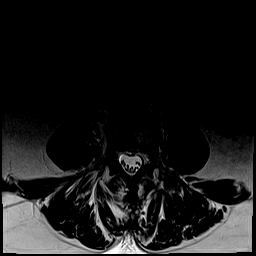
[im 16/34]
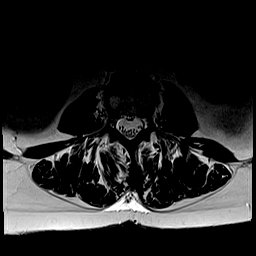
[im 18/34]
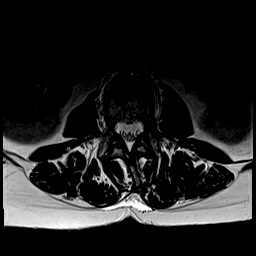
[im 23/34]
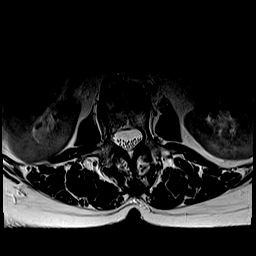
[im 28/34]
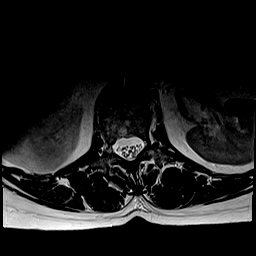
[im 34/34]
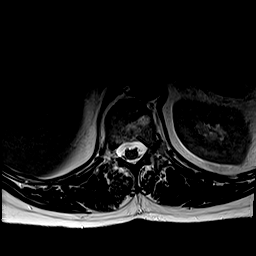

[Series 9: T1 · axial · 4.0mm · 0.39mm/px · z∈[-126,+71]mm · 8 of 34 slices shown (2 of 2)]
[im 1/34]
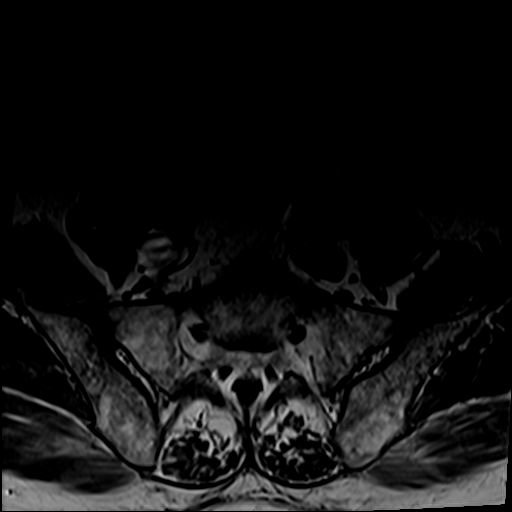
[im 6/34]
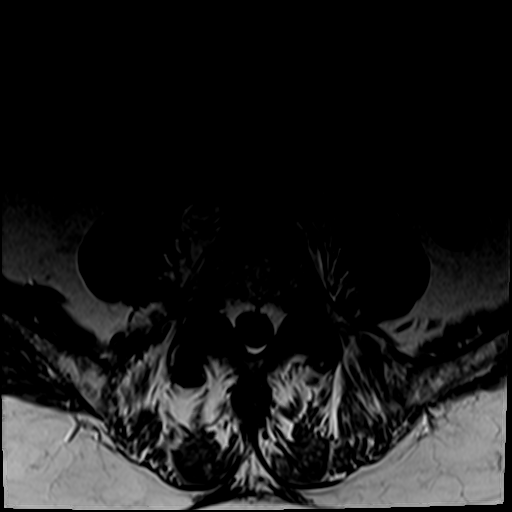
[im 11/34]
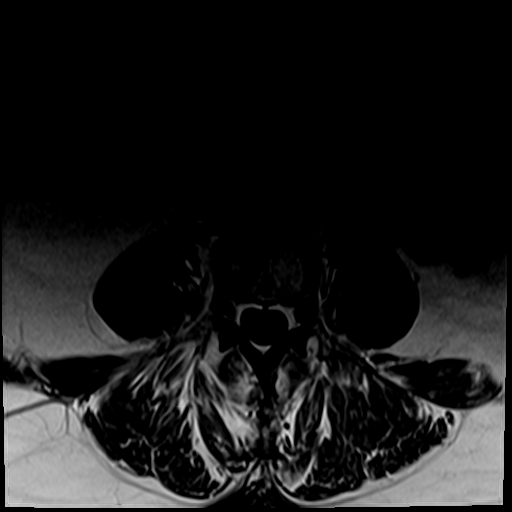
[im 16/34]
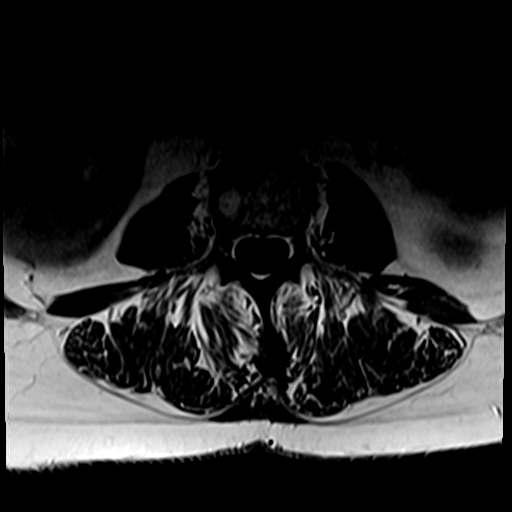
[im 18/34]
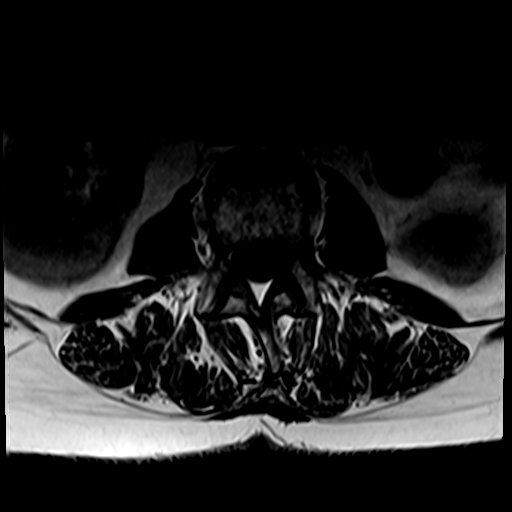
[im 23/34]
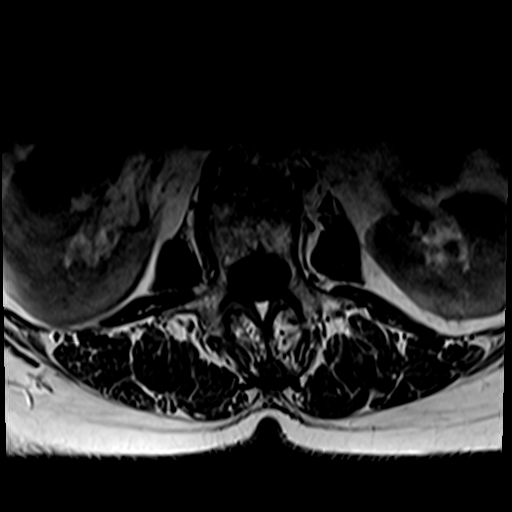
[im 28/34]
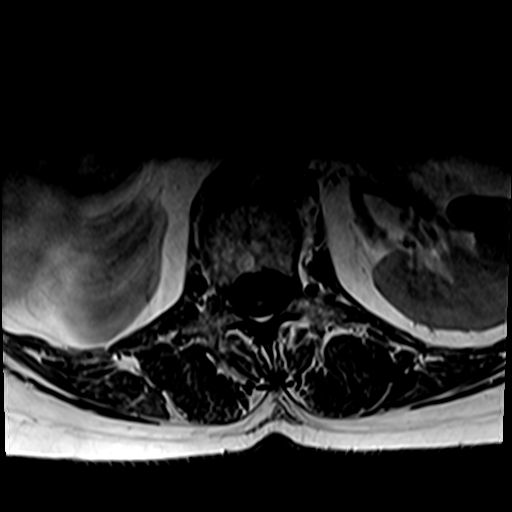
[im 34/34]
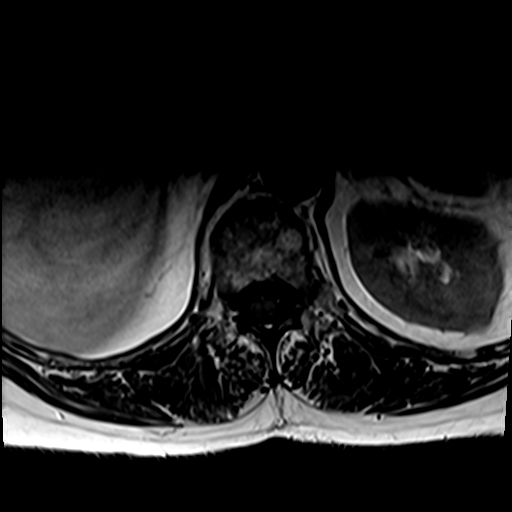

[30 of 48 positions shown; findings below may reference images not displayed]

FINDINGS: Segmentation:  Standard.

Alignment:  Slight (grade 1) anterolisthesis of L5 on S1.

Vertebrae: Mildly heterogeneous bone marrow with scattered benign
vertebral venous malformations. No suspicious bone lesion.

Conus medullaris and cauda equina: Conus extends to the L1 level.
Conus appears normal.

Paraspinal and other soft tissues: Unremarkable.

Disc levels:

T11-T12: Only imaged sagittally. Small disc bulge without evidence
of significant canal or foraminal stenosis. Bilateral perineural
root sleeve cyst. Superior T12 endplate Schmorl's node.

T12-L1: Mild right eccentric disc bulge with mild canal stenosis. No
significant foraminal stenosis. Perineural root sleeve cysts.

L1-L2: Broad disc bulge without significant canal or foraminal
stenosis.

L2-L3: Broad disc bulge and mildly prominent dorsal epidural fat.
Resulting mild canal stenosis. No significant foraminal stenosis.

L3-L4: Broad disc bulge with superimposed small right subarticular
disc protrusion. Small right far lateral/extraforaminal disc
protrusion which closely approximates the exiting/exited right L3
nerve (series 5, image 5; series 8, image 21). Mild bilateral
foraminal stenosis. Mild canal stenosis.

L4-L5: Broad disc bulge with mild bilateral facet arthropathy.
Mildly prominent dorsal epidural fat. Resulting mild canal stenosis.
Mild bilateral foraminal stenosis.

L5-S1: Slight (grade 1) anterolisthesis of L5 on S1. Slightly left
eccentric broad disc bulge. Moderate to severe bilateral facet
arthropathy. Resulting severe left and moderate to severe right
foraminal stenosis. On the right there is a suspected 6 mm
anteriorly directed synovial cyst along the far lateral facet (see
series 5, image 4; series 7, image 4; series 8, image 29) which
contacts the exiting L5 nerve. No significant canal stenosis.
IMPRESSION: 1. At L5-S1, severe left and moderate to severe right foraminal
stenosis. On the right there is a suspected 6 mm anteriorly directed
synovial cyst along the far lateral facet, which contacts the
exiting L5 nerve.
2. Mild canal and bilateral foraminal stenosis at L3-L4 and L4-L5,
as detailed above. Additional mild canal stenosis at T12-L1 and
L2-L3.

## 2023-03-04 ENCOUNTER — Ambulatory Visit: Payer: PPO | Admitting: Dermatology

## 2023-03-04 DIAGNOSIS — L814 Other melanin hyperpigmentation: Secondary | ICD-10-CM

## 2023-03-04 DIAGNOSIS — L821 Other seborrheic keratosis: Secondary | ICD-10-CM

## 2023-03-04 DIAGNOSIS — L820 Inflamed seborrheic keratosis: Secondary | ICD-10-CM

## 2023-03-04 NOTE — Patient Instructions (Addendum)

## 2023-03-04 NOTE — Progress Notes (Signed)
   Follow-Up Visit   Subjective  Vanessa Clark is a 72 y.o. female who presents for the following: ISKs shoulder, inframammary, itchy, irritated by bra, inguinal crease The patient has spots, moles and lesions to be evaluated, some may be new or changing and the patient may have concern these could be cancer.   The following portions of the chart were reviewed this encounter and updated as appropriate: medications, allergies, medical history  Review of Systems:  No other skin or systemic complaints except as noted in HPI or Assessment and Plan.  Objective  Well appearing patient in no apparent distress; mood and affect are within normal limits.   A focused examination was performed of the following areas: trunk  Relevant exam findings are noted in the Assessment and Plan.  L breast/inframammary x 18, L ant shoulder x 1 (19) Stuck on waxy paps with erythema    Assessment & Plan   SEBORRHEIC KERATOSIS - Stuck-on, waxy, tan-brown papules and/or plaques  - Benign-appearing - Discussed benign etiology and prognosis. - Observe - Call for any changes  LENTIGINES Exam: scattered tan macules Due to sun exposure Treatment Plan: Benign-appearing, observe. Recommend daily broad spectrum sunscreen SPF 30+ to sun-exposed areas, reapply every 2 hours as needed.  Call for any changes   Inflamed seborrheic keratosis (19) L breast/inframammary x 18, L ant shoulder x 1  Symptomatic, irritating, patient would like treated.  Pt has difficulty tolerating the pain from cryotherapy  Discussed if patient gets Emla prescription her PCP sent in, she would apply to one small area under occlusion at least 20 minutes prior to appointment   Used Freeze cooling spray today to each lesion prior to treating with LN2  Destruction of lesion - L breast/inframammary x 18, L ant shoulder x 1 (19)  Destruction method: cryotherapy   Destruction method comment:  Topical anesthetic spray prior to  LN2 Informed consent: discussed and consent obtained   Lesion destroyed using liquid nitrogen: Yes   Region frozen until ice ball extended beyond lesion: Yes   Outcome: patient tolerated procedure well with no complications   Post-procedure details: wound care instructions given   Additional details:  Prior to procedure, discussed risks of blister formation, small wound, skin dyspigmentation, or rare scar following cryotherapy. Recommend Vaseline ointment to treated areas while healing.   Related Medications triamcinolone cream (KENALOG) 0.1 % Apply to irritated itchy areas on the body, Avoid applying to face, groin, and axilla. Use as directed. Risk of skin atrophy with long-term use reviewed.    Return for ISK f/u.  I, Ardis Rowan, RMA, am acting as scribe for Willeen Niece, MD .   Documentation: I have reviewed the above documentation for accuracy and completeness, and I agree with the above.  Willeen Niece, MD

## 2023-03-25 ENCOUNTER — Ambulatory Visit: Payer: PPO | Admitting: Dermatology

## 2023-03-25 DIAGNOSIS — L820 Inflamed seborrheic keratosis: Secondary | ICD-10-CM | POA: Diagnosis not present

## 2023-03-25 DIAGNOSIS — L814 Other melanin hyperpigmentation: Secondary | ICD-10-CM

## 2023-03-25 DIAGNOSIS — L821 Other seborrheic keratosis: Secondary | ICD-10-CM

## 2023-03-25 NOTE — Patient Instructions (Signed)

## 2023-03-25 NOTE — Progress Notes (Signed)
   Follow-Up Visit   Subjective  Vanessa Clark is a 72 y.o. female who presents for the following: Inflamed Seborrheic Keratoses follow-up, inframammary. She has applied lidocaine 2.5%/prilocaine 2.5% creams to the inframammary and a couple of areas on her back prior to LN2.   The patient has spots, moles and lesions to be evaluated, some may be new or changing.    The following portions of the chart were reviewed this encounter and updated as appropriate: medications, allergies, medical history  Review of Systems:  No other skin or systemic complaints except as noted in HPI or Assessment and Plan.  Objective  Well appearing patient in no apparent distress; mood and affect are within normal limits.  A focused examination was performed of the following areas: Trunk  Relevant physical exam findings are noted in the Assessment and Plan.  L mid lower back x 7, L inframammary x 12, R inframammary x 17 (36) Erythematous stuck-on, waxy papule or plaque  Assessment & Plan   INFLAMED SEBORRHEIC KERATOSIS (36) L mid lower back x 7, L inframammary x 12, R inframammary x 17 (36) Symptomatic, irritating, patient would like treated.  Pt has difficulty tolerating the pain from cryotherapy.   Pt treated areas prior to appt with Emla cream under occlusion.    Used Freeze cooling spray today to each lesion prior to treating with LN2. Destruction of lesion - L mid lower back x 7, L inframammary x 12, R inframammary x 17 (36)  Destruction method: cryotherapy   Informed consent: discussed and consent obtained   Lesion destroyed using liquid nitrogen: Yes   Region frozen until ice ball extended beyond lesion: Yes   Outcome: patient tolerated procedure well with no complications   Post-procedure details: wound care instructions given   Additional details:  Prior to procedure, discussed risks of blister formation, small wound, skin dyspigmentation, or rare scar following cryotherapy. Recommend  Vaseline ointment to treated areas while healing.  Related Medications triamcinolone cream (KENALOG) 0.1 % Apply to irritated itchy areas on the body, Avoid applying to face, groin, and axilla. Use as directed. Risk of skin atrophy with long-term use reviewed.  SEBORRHEIC KERATOSIS - Stuck-on, waxy, tan-brown papules and/or plaques  - Benign-appearing - Discussed benign etiology and prognosis. - Observe - Call for any changes  LENTIGINES Exam: scattered tan macules Due to sun exposure Treatment Plan: Benign-appearing, observe. Recommend daily broad spectrum sunscreen SPF 30+ to sun-exposed areas, reapply every 2 hours as needed.  Call for any changes      Return in about 2 months (around 05/26/2023) for ISKs.  ICherlyn Labella, CMA, am acting as scribe for Willeen Niece, MD .   Documentation: I have reviewed the above documentation for accuracy and completeness, and I agree with the above.  Willeen Niece, MD

## 2023-05-01 ENCOUNTER — Ambulatory Visit: Payer: PPO | Admitting: Dermatology

## 2023-05-20 ENCOUNTER — Ambulatory Visit: Payer: PPO | Admitting: Dermatology

## 2023-05-20 DIAGNOSIS — L578 Other skin changes due to chronic exposure to nonionizing radiation: Secondary | ICD-10-CM

## 2023-05-20 DIAGNOSIS — W908XXA Exposure to other nonionizing radiation, initial encounter: Secondary | ICD-10-CM

## 2023-05-20 DIAGNOSIS — L821 Other seborrheic keratosis: Secondary | ICD-10-CM

## 2023-05-20 DIAGNOSIS — L820 Inflamed seborrheic keratosis: Secondary | ICD-10-CM

## 2023-05-20 NOTE — Patient Instructions (Signed)

## 2023-05-20 NOTE — Progress Notes (Signed)
   Follow-Up Visit   Subjective  Vanessa Clark is a 73 y.o. female who presents for the following: patient here today for follow up on isks under inframammary areas and back. She reports she has a lot of irritated, itchy areas she would like treated inframammary area.   The patient has spots, moles and lesions to be evaluated, some may be new or changing and the patient may have concern these could be cancer.   The following portions of the chart were reviewed this encounter and updated as appropriate: medications, allergies, medical history  Review of Systems:  No other skin or systemic complaints except as noted in HPI or Assessment and Plan.  Objective  Well appearing patient in no apparent distress; mood and affect are within normal limits.   A focused examination was performed of the following areas: inframammary area   Relevant exam findings are noted in the Assessment and Plan.  right inframammary x 40 (40) Erythematous stuck-on, waxy papule or plaque  Assessment & Plan   SEBORRHEIC KERATOSIS - Stuck-on, waxy, tan-brown papules and/or plaques  - Benign-appearing - Discussed benign etiology and prognosis. - Observe - Call for any changes   ACTINIC DAMAGE - chronic, secondary to cumulative UV radiation exposure/sun exposure over time - diffuse scaly erythematous macules with underlying dyspigmentation - Recommend daily broad spectrum sunscreen SPF 30+ to sun-exposed areas, reapply every 2 hours as needed.  - Recommend staying in the shade or wearing long sleeves, sun glasses (UVA+UVB protection) and wide brim hats (4-inch brim around the entire circumference of the hat). - Call for new or changing lesions.    INFLAMED SEBORRHEIC KERATOSIS (40) right inframammary x 40 (40) Symptomatic, irritating, patient would like treated. Destruction of lesion - right inframammary x 40 (40)  Destruction method: cryotherapy   Informed consent: discussed and consent obtained    Lesion destroyed using liquid nitrogen: Yes   Region frozen until ice ball extended beyond lesion: Yes   Outcome: patient tolerated procedure well with no complications   Post-procedure details: wound care instructions given   Additional details:  Prior to procedure, discussed risks of blister formation, small wound, skin dyspigmentation, or rare scar following cryotherapy. Recommend Vaseline ointment to treated areas while healing.  Related Medications triamcinolone cream (KENALOG) 0.1 % Apply to irritated itchy areas on the body, Avoid applying to face, groin, and axilla. Use as directed. Risk of skin atrophy with long-term use reviewed.  Return for 2 month isk follow up.  I, Asher Muir, CMA, am acting as scribe for Willeen Niece, MD.   Documentation: I have reviewed the above documentation for accuracy and completeness, and I agree with the above.  Willeen Niece, MD

## 2023-07-16 ENCOUNTER — Encounter: Payer: Self-pay | Admitting: Dermatology

## 2023-07-16 ENCOUNTER — Ambulatory Visit: Payer: PPO | Admitting: Dermatology

## 2023-07-16 DIAGNOSIS — L820 Inflamed seborrheic keratosis: Secondary | ICD-10-CM

## 2023-07-16 DIAGNOSIS — L814 Other melanin hyperpigmentation: Secondary | ICD-10-CM

## 2023-07-16 DIAGNOSIS — L821 Other seborrheic keratosis: Secondary | ICD-10-CM | POA: Diagnosis not present

## 2023-07-16 DIAGNOSIS — Z808 Family history of malignant neoplasm of other organs or systems: Secondary | ICD-10-CM

## 2023-07-16 DIAGNOSIS — B078 Other viral warts: Secondary | ICD-10-CM

## 2023-07-16 DIAGNOSIS — K130 Diseases of lips: Secondary | ICD-10-CM | POA: Diagnosis not present

## 2023-07-16 MED ORDER — LIDOCAINE-PRILOCAINE 2.5-2.5 % EX CREA: TOPICAL_CREAM | CUTANEOUS | 0 refills | Status: DC

## 2023-07-16 MED ORDER — KETOCONAZOLE 2 % EX CREA: TOPICAL_CREAM | CUTANEOUS | 2 refills | Status: AC

## 2023-07-16 NOTE — Progress Notes (Signed)
 Follow-Up Visit   Subjective  Vanessa Clark is a 73 y.o. female who presents for the following: 2 month ISK follow up. >30 lesions treated at right inframammary at last visit. Patient has a list of areas she would like examined and possibly treated today. C/O dry scaly areas. Rough patches. Rubbed, irritated, itching.   Daughter diagnosed with metastatic melanoma, stage IV, since patient's last visit here. Going through immunotherapy treatments. Every 3 weeks for 4 treatments.  Husband has golf ball sized tumor in frontal brain area.   The patient has spots, moles and lesions to be evaluated, some may be new or changing and the patient may have concern these could be cancer.    The following portions of the chart were reviewed this encounter and updated as appropriate: medications, allergies, medical history  Review of Systems:  No other skin or systemic complaints except as noted in HPI or Assessment and Plan.  Objective  Well appearing patient in no apparent distress; mood and affect are within normal limits.  A focused examination was performed of the following areas: Face, arms, torso, legs, feet   Relevant physical exam findings are noted in the Assessment and Plan.  R post thigh x2, R ant thigh x2, R med thigh x3, R med knee x1, R calf x1, R sideburn x1, R temporal hair line x1, L temple hair line x1, L zygoma x1, L ant ankle x1, L calf x1, L post thigh x2, L upper ant thigh x1, back x8, abd x1 (27) Erythematous keratotic or waxy stuck-on papule or plaque. Left Thumb Proximal Nail Fold x1 Verrucous papules -- Discussed viral etiology and contagion.        Assessment & Plan   SEBORRHEIC KERATOSIS VS LENTIGO - 6 mm gray brown macule at lower sternum. Photo taken today  - Benign-appearing - Discussed benign etiology and prognosis. - Observe - Call for any changes  SEBORRHEIC KERATOSIS - Stuck-on, waxy, tan-brown papules and/or plaques body/face -  Benign-appearing - Discussed benign etiology and prognosis. 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 Condition is bothersome/symptomatic for patient. Currently flared.   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  A moist environment from saliva promotes overgrowth of yeast and bacteria, resulting in chronic inflammation in this area.   Treatment Plan Start Ketoconazole 2% cream twice a day to corners of mouth at needed.   ANGULAR CHEILITIS   INFLAMED SEBORRHEIC KERATOSIS (27) R post thigh x2, R ant thigh x2, R med thigh x3, R med knee x1, R calf x1, R sideburn x1, R temporal hair line x1, L temple hair line x1, L zygoma x1, L ant ankle x1, L calf x1, L post thigh x2, L upper ant thigh x1, back x8, abd x1 (27) Symptomatic, irritating, patient would like treated.  Rf send in of EMLA cream which she applies to ISKs in sensitive skin areas prior to cryotherapy to help with pain. Destruction of lesion - R post thigh x2, R ant thigh x2, R med thigh x3, R med knee x1, R calf x1, R sideburn x1, R temporal hair line x1, L temple hair line x1, L zygoma x1, L ant ankle x1, L calf x1, L post thigh x2, L upper ant thigh x1, back x8, abd x1 (27)  Destruction method: cryotherapy   Informed consent: discussed and  consent obtained   Lesion destroyed using liquid nitrogen: Yes   Region frozen until ice ball extended beyond lesion: Yes   Outcome: patient tolerated procedure well with no complications   Post-procedure details: wound care instructions given   Additional details:  Prior to procedure, discussed risks of blister formation, small wound, skin dyspigmentation, or rare scar following cryotherapy. Recommend Vaseline ointment to treated areas while healing.  Related Medications triamcinolone cream (KENALOG) 0.1  % Apply to irritated itchy areas on the body, Avoid applying to face, groin, and axilla. Use as directed. Risk of skin atrophy with long-term use reviewed. OTHER VIRAL WARTS Left Thumb Proximal Nail Fold x1 Viral Wart (HPV) Counseling  Discussed viral / HPV (Human Papilloma Virus) etiology and risk of spread /infectivity to other areas of body as well as to other people.  Multiple treatments and methods may be required to clear warts and it is possible treatment may not be successful.  Treatment risks include discoloration; scarring and there is still potential for wart recurrence. Destruction of lesion - Left Thumb Proximal Nail Fold x1  Destruction method: cryotherapy   Informed consent: discussed and consent obtained   Lesion destroyed using liquid nitrogen: Yes   Region frozen until ice ball extended beyond lesion: Yes   Outcome: patient tolerated procedure well with no complications   Post-procedure details: wound care instructions given   Additional details:  Prior to procedure, discussed risks of blister formation, small wound, skin dyspigmentation, or rare scar following cryotherapy. Recommend Vaseline ointment to treated areas while healing.  LENTIGO   SEBORRHEIC KERATOSIS     Return for ISK Follow Up in 6-8 weeks.  I, Jill Parcell, CMA, am acting as scribe for Artemio Larry, MD.   Documentation: I have reviewed the above documentation for accuracy and completeness, and I agree with the above.  Artemio Larry, MD

## 2023-07-16 NOTE — Patient Instructions (Addendum)
 Start Ketoconazole 2% cream twice a day to corners of mouth at needed.    Cryotherapy Aftercare  Wash gently with soap and water everyday.   Apply Vaseline Jelly daily until healed.     Recommend daily broad spectrum sunscreen SPF 30+ to sun-exposed areas, reapply every 2 hours as needed. Call for new or changing lesions.  Staying in the shade or wearing long sleeves, sun glasses (UVA+UVB protection) and wide brim hats (4-inch brim around the entire circumference of the hat) are also recommended for sun protection.     Due to recent changes in healthcare laws, you may see results of your pathology and/or laboratory studies on MyChart before the doctors have had a chance to review them. We understand that in some cases there may be results that are confusing or concerning to you. Please understand that not all results are received at the same time and often the doctors may need to interpret multiple results in order to provide you with the best plan of care or course of treatment. Therefore, we ask that you please give [place] 2 business days to thoroughly review all your results before contacting the office for clarification. Should we see a critical lab result, you will be contacted sooner.   If You Need Anything After Your Visit  If you have any questions or concerns for your doctor, please call our main line at [phone] and press option 4 to reach your doctor's medical assistant. If no one answers, please leave a voicemail as directed and we will return your call as soon as possible. Messages left after 4 pm will be answered the following business day.   You may also send [place] a message via MyChart. We typically respond to MyChart messages within 1-2 business days.  For prescription refills, please ask your pharmacy to contact our office. Our fax number is [phone].  If you have an urgent issue when the clinic is closed that cannot wait until the next business day, you can page your  doctor at the number below.    Please note that while we do our best to be available for urgent issues outside of office hours, we are not available 24/7.   If you have an urgent issue and are unable to reach [place], you may choose to seek medical care at your doctor's office, retail clinic, urgent care center, or emergency room.  If you have a medical emergency, please immediately call 911 or go to the emergency department.  Pager Numbers  - Dr. Gwen Pounds: [phone]  - Dr. Roseanne Reno: [phone]  - Dr. Katrinka Blazing: [phone]   In the event of inclement weather, please call our main line at [phone] for an update on the status of any delays or closures.  Dermatology Medication Tips: Please keep the boxes that topical medications come in in order to help keep track of the instructions about where and how to use these. Pharmacies typically print the medication instructions only on the boxes and not directly on the medication tubes.   If your medication is too expensive, please contact our office at [phone] option 4 or send [place] a message through MyChart.   We are unable to tell what your co-pay for medications will be in advance as this is different depending on your insurance coverage. However, we may be able to find a substitute medication at lower cost or fill out paperwork to get insurance to cover a needed medication.   If a prior authorization is required to get  your medication covered by your insurance company, please allow [place] 1-2 business days to complete this process.  Drug prices often vary depending on where the prescription is filled and some pharmacies may offer cheaper prices.  The website www.goodrx.com contains coupons for medications through different pharmacies. The prices here do not account for what the cost may be with help from insurance (it may be cheaper with your insurance), but the website can give you the price if you did not use any insurance.  - You can print  the associated coupon and take it with your prescription to the pharmacy.  - You may also stop by our office during regular business hours and pick up a GoodRx coupon card.  - If you need your prescription sent electronically to a different pharmacy, notify our office through [facility] or by phone at [phone] option 4.     Si Usted Necesita Algo Despus de Su Visita  Tambin puede enviarnos un mensaje a travs de Clinical cytogeneticist. Por lo general respondemos a los mensajes de MyChart en el transcurso de 1 a 2 das hbiles.  Para renovar recetas, por favor pida a su farmacia que se ponga en contacto con nuestra oficina. Annie Sable de fax es [place] [phone].  Si tiene un asunto urgente cuando la clnica est cerrada y que no puede esperar hasta el siguiente da hbil, puede llamar/localizar a su doctor(a) al nmero que aparece a continuacin.   Por favor, tenga en cuenta que aunque hacemos todo lo posible para estar disponibles para asuntos urgentes fuera del horario de [place], no estamos disponibles las 24 horas del da, los 7 [address]  [address] de la [place].   Si tiene un problema urgente y no puede comunicarse con nosotros, puede optar por buscar atencin mdica  en el consultorio de su doctor(a), en una clnica privada, en un centro de atencin urgente o en una sala de emergencias.  Si tiene Engineer, drilling, por favor llame inmediatamente al 911 o vaya a la sala de emergencias.  Nmeros de bper  - Dr. Gwen Pounds: [phone]  - Dra. Roseanne Reno: [phone]  - Dr. Katrinka Blazing: [phone]   En caso de inclemencias del tiempo, por favor llame a Lacy Duverney principal al [phone] para una actualizacin sobre el [place] de cualquier retraso o cierre.  Consejos para la medicacin en dermatologa: Por favor, guarde las cajas en las que vienen los medicamentos de uso tpico para ayudarle a seguir las instrucciones sobre dnde y cmo usarlos. Las farmacias generalmente imprimen las  instrucciones del medicamento slo en las cajas y no directamente en los tubos del [place].   Si su medicamento es muy caro, por favor, pngase en contacto con Rolm Gala llamando al [phone] y presione la opcin 4 o envenos un mensaje a travs de Clinical cytogeneticist.   No podemos decirle cul ser su copago por los medicamentos por adelantado ya que esto es diferente dependiendo de la cobertura de su seguro. Sin embargo, es posible que podamos encontrar un medicamento sustituto a Audiological scientist un formulario para que el seguro cubra el medicamento que se considera necesario.   Si se requiere una autorizacin previa para que su compaa de seguros [place] su medicamento, por favor permtanos de 1 a 2 das hbiles para completar [address]  Los precios de los medicamentos varan con frecuencia dependiendo del Environmental consultant de dnde se surte la receta y alguna farmacias pueden ofrecer precios ms baratos.  El sitio web www.goodrx.com tiene cupones para medicamentos de Health and safety inspector.  Los precios aqu no tienen en cuenta lo que podra costar con la ayuda del seguro (puede ser ms barato con su seguro), pero el sitio web puede darle el precio si no utiliz Tourist information centre manager.  - Puede imprimir el cupn correspondiente y llevarlo con su receta a la farmacia.  - Tambin puede pasar por nuestra oficina durante el horario de atencin regular y Education officer, museum una tarjeta de cupones de GoodRx.  - Si necesita que su receta se enve electrnicamente a una farmacia diferente, informe a nuestra oficina a travs de MyChart de [place] o por telfono llamando al [phone] y presione la opcin 4.

## 2023-07-18 ENCOUNTER — Telehealth: Payer: Self-pay

## 2023-07-18 NOTE — Telephone Encounter (Signed)
 Lidocaine-prilocaine 2.5%-2.5% cream denied by insurance. Please advise.

## 2023-08-05 ENCOUNTER — Other Ambulatory Visit: Payer: Self-pay

## 2023-08-05 MED ORDER — LIDOCAINE-PRILOCAINE 2.5-2.5 % EX CREA: TOPICAL_CREAM | CUTANEOUS | 0 refills | Status: AC

## 2023-08-05 NOTE — Progress Notes (Signed)
 Change of pharmacy for cheaper GoodRX coupon. aw

## 2023-09-02 ENCOUNTER — Ambulatory Visit: Admitting: Dermatology

## 2024-04-03 ENCOUNTER — Other Ambulatory Visit: Payer: Self-pay

## 2024-04-03 ENCOUNTER — Telehealth: Payer: Self-pay

## 2024-04-03 DIAGNOSIS — Z860100 Personal history of colon polyps, unspecified: Secondary | ICD-10-CM

## 2024-04-03 MED ORDER — NA SULFATE-K SULFATE-MG SULF 17.5-3.13-1.6 GM/177ML PO SOLN: 1.0000 | Freq: Once | ORAL | 0 refills | Status: AC

## 2024-04-03 NOTE — Telephone Encounter (Signed)
 Gastroenterology Pre-Procedure Review  Request Date: 06/23/24 Requesting Physician: Dr. Melany  PATIENT REVIEW QUESTIONS: The patient responded to the following health history questions as indicated:    1. Are you having any GI issues? no 2. 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 yes (Last colonoscopy performed by Dr. Matilda 01/23/21 recommended repeat in 3 years) 3. Do you have a family history of Colon Cancer or Polyps? no 4. Diabetes Mellitus? no 5. Joint replacements in the past 12 months?no 6. Major health problems in the past 3 months?no 7. Any artificial heart valves, MVP, or defibrillator?no    MEDICATIONS & ALLERGIES:    Patient reports the following regarding taking any anticoagulation/antiplatelet therapy:   Plavix, Coumadin, Eliquis, Xarelto, Lovenox, Pradaxa, Brilinta, or Effient? no Aspirin? no  Patient confirms/reports the following medications:  Current Outpatient Medications  Medication Sig Dispense Refill   Adalimumab (HUMIRA [place]) Inject into the skin.     amLODipine (NORVASC) 5 MG tablet Take 5 mg by mouth daily.     betamethasone dipropionate (DIPROLENE) 0.05 % cream   2   cannabidiol (EPIDIOLEX) 100 MG/ML solution Take by mouth 2 (two) times daily.     cetirizine (ZYRTEC) 10 MG tablet Take 10 mg by mouth daily.     chlorthalidone (HYGROTON) 25 MG tablet Take 25 mg by mouth daily.     DULoxetine (CYMBALTA) 60 MG capsule Take 60 mg by mouth daily.     gabapentin (NEURONTIN) 300 MG capsule Take 300 mg by mouth 3 (three) times daily.     HYDROcodone-acetaminophen (NORCO/VICODIN) 5-325 MG tablet Take 1 tablet by mouth every 6 (six) hours as needed for moderate pain.     hydrocortisone  2.5 % cream Apply to rash under the breast twice daily up to two weeks. 28 g 0   ketoconazole  (NIZORAL ) 2 % cream Apply to the rash under the breast twice daily as needed. 30 g 0   ketoconazole  (NIZORAL ) 2 % cream Apply twice daily to corners of mouth as needed. 30 g 2    lidocaine -prilocaine  (EMLA ) cream Apply 20-30 minutes prior to procedure. Do not apply to large body surface area. 30 g 0   loratadine (CLARITIN) 10 MG tablet Frequency:PRN   Dosage:0.0     Instructions:  Note:Dose: UNKNOWN     Melatonin 10 MG TABS Take by mouth at bedtime.     meloxicam (MOBIC) 15 MG tablet Take 15 mg by mouth.     mometasone  (ELOCON ) 0.1 % ointment Apply to fissures in fingers at night. 45 g 0   NEOMYCIN -POLYMYXIN-HYDROCORTISONE  (CORTISPORIN) 1 % SOLN OTIC solution Apply 1-2 drops to toe BID after soaking 10 mL 1   omeprazole (PRILOSEC) 20 MG capsule Take 20 mg by mouth.     Probiotic Product (PROBIOTIC DAILY PO) Take by mouth daily.     tretinoin  (RETIN-A ) 0.05 % cream Apply a small amount to face every night as tolerated. 45 g 2   triamcinolone  cream (KENALOG ) 0.1 % Apply to irritated itchy areas on the body, Avoid applying to face, groin, and axilla. Use as directed. Risk of skin atrophy with long-term use reviewed. 454 g 0   valACYclovir (VALTREX) 1000 MG tablet Take 1,000 mg by mouth 3 (three) times daily.     valsartan (DIOVAN) 80 MG tablet Take 80 mg by mouth daily.     No current facility-administered medications for this visit.    Patient confirms/reports the following allergies:  Allergies[1]  No orders of the defined types were placed  in this encounter.   AUTHORIZATION INFORMATION Primary Insurance: 1D#: Group #:  Secondary Insurance: 1D#: Group #:  SCHEDULE INFORMATION: Date: 06/23/24 Time: Location: [facility]    [1]  Allergies Allergen Reactions   Sulfacetamide Sodium Other (See Comments)    Not effective   Sulfa Antibiotics Other (See Comments)    Not effective

## 2024-04-03 NOTE — Telephone Encounter (Signed)
 Instructions corrected.  Patient stated that she just recently started taking Mounjaro.  Instructions corrected to note (7) days prior stop medication.  Thanks,  [place], CMA

## 2024-06-23 ENCOUNTER — Ambulatory Visit: Admit: 2024-06-23 | Admitting: Gastroenterology

## 2024-06-23 SURGERY — COLONOSCOPY
Anesthesia: General
